# Patient Record
Sex: Female | Born: 1955 | Race: White | Hispanic: No | Marital: Married | Smoking: Never smoker
Health system: Southern US, Community
[De-identification: ages and names within clinical notes are randomized; demographics above are authoritative.]

## PROBLEM LIST (undated history)

## (undated) DIAGNOSIS — K219 Gastro-esophageal reflux disease without esophagitis: Secondary | ICD-10-CM

## (undated) DIAGNOSIS — G43909 Migraine, unspecified, not intractable, without status migrainosus: Secondary | ICD-10-CM

## (undated) DIAGNOSIS — K589 Irritable bowel syndrome without diarrhea: Secondary | ICD-10-CM

## (undated) HISTORY — PX: OTHER SURGICAL HISTORY: SHX169

## (undated) HISTORY — PX: OOPHORECTOMY: SHX86

## (undated) HISTORY — PX: FOOT SURGERY: SHX648

## (undated) HISTORY — PX: TONSILLECTOMY: SUR1361

## (undated) HISTORY — PX: NOSE SURGERY: SHX723

## (undated) HISTORY — PX: CHOLECYSTECTOMY: SHX55

## (undated) HISTORY — DX: Migraine, unspecified, not intractable, without status migrainosus: G43.909

## (undated) HISTORY — DX: Gastro-esophageal reflux disease without esophagitis: K21.9

## (undated) HISTORY — PX: KNEE SURGERY: SHX244

## (undated) HISTORY — DX: Irritable bowel syndrome, unspecified: K58.9

---

## 1999-07-31 ENCOUNTER — Ambulatory Visit (HOSPITAL_COMMUNITY): Admission: RE | Admit: 1999-07-31 | Discharge: 1999-07-31 | Payer: Self-pay | Admitting: Gastroenterology

## 2000-04-22 ENCOUNTER — Encounter: Admission: RE | Admit: 2000-04-22 | Discharge: 2000-04-22 | Payer: Self-pay | Admitting: Family Medicine

## 2000-04-22 ENCOUNTER — Encounter: Payer: Self-pay | Admitting: Family Medicine

## 2000-11-08 ENCOUNTER — Ambulatory Visit (HOSPITAL_COMMUNITY): Admission: RE | Admit: 2000-11-08 | Discharge: 2000-11-08 | Payer: Self-pay | Admitting: Gastroenterology

## 2000-11-08 ENCOUNTER — Encounter: Payer: Self-pay | Admitting: Gastroenterology

## 2000-12-02 ENCOUNTER — Ambulatory Visit (HOSPITAL_COMMUNITY): Admission: RE | Admit: 2000-12-02 | Discharge: 2000-12-02 | Payer: Self-pay | Admitting: Gastroenterology

## 2001-04-26 ENCOUNTER — Encounter: Admission: RE | Admit: 2001-04-26 | Discharge: 2001-04-26 | Payer: Self-pay | Admitting: Family Medicine

## 2001-04-26 ENCOUNTER — Encounter: Payer: Self-pay | Admitting: Family Medicine

## 2002-05-04 ENCOUNTER — Encounter: Payer: Self-pay | Admitting: Family Medicine

## 2002-05-04 ENCOUNTER — Encounter: Admission: RE | Admit: 2002-05-04 | Discharge: 2002-05-04 | Payer: Self-pay | Admitting: Family Medicine

## 2002-10-02 ENCOUNTER — Other Ambulatory Visit: Admission: RE | Admit: 2002-10-02 | Discharge: 2002-10-02 | Payer: Self-pay | Admitting: Family Medicine

## 2002-12-26 ENCOUNTER — Encounter: Payer: Self-pay | Admitting: Gastroenterology

## 2002-12-26 ENCOUNTER — Ambulatory Visit (HOSPITAL_COMMUNITY): Admission: RE | Admit: 2002-12-26 | Discharge: 2002-12-26 | Payer: Self-pay | Admitting: Gastroenterology

## 2003-07-15 ENCOUNTER — Encounter: Payer: Self-pay | Admitting: Family Medicine

## 2003-07-15 ENCOUNTER — Encounter: Admission: RE | Admit: 2003-07-15 | Discharge: 2003-07-15 | Payer: Self-pay | Admitting: Family Medicine

## 2004-07-29 ENCOUNTER — Encounter: Admission: RE | Admit: 2004-07-29 | Discharge: 2004-07-29 | Payer: Self-pay | Admitting: Family Medicine

## 2004-11-17 ENCOUNTER — Ambulatory Visit: Payer: Self-pay | Admitting: Family Medicine

## 2004-11-18 ENCOUNTER — Encounter: Admission: RE | Admit: 2004-11-18 | Discharge: 2004-11-18 | Payer: Self-pay | Admitting: Family Medicine

## 2005-03-15 ENCOUNTER — Ambulatory Visit: Payer: Self-pay | Admitting: Family Medicine

## 2005-08-13 ENCOUNTER — Encounter: Admission: RE | Admit: 2005-08-13 | Discharge: 2005-08-13 | Payer: Self-pay | Admitting: Family Medicine

## 2005-08-27 ENCOUNTER — Encounter: Admission: RE | Admit: 2005-08-27 | Discharge: 2005-08-27 | Payer: Self-pay | Admitting: Family Medicine

## 2005-12-28 ENCOUNTER — Ambulatory Visit: Payer: Self-pay | Admitting: Family Medicine

## 2006-03-24 ENCOUNTER — Other Ambulatory Visit: Admission: RE | Admit: 2006-03-24 | Discharge: 2006-03-24 | Payer: Self-pay | Admitting: Family Medicine

## 2006-03-24 ENCOUNTER — Ambulatory Visit: Payer: Self-pay | Admitting: Family Medicine

## 2006-03-24 ENCOUNTER — Encounter: Payer: Self-pay | Admitting: Family Medicine

## 2006-06-27 ENCOUNTER — Ambulatory Visit: Payer: Self-pay | Admitting: Family Medicine

## 2006-08-17 ENCOUNTER — Encounter: Admission: RE | Admit: 2006-08-17 | Discharge: 2006-08-17 | Payer: Self-pay | Admitting: Family Medicine

## 2006-08-30 ENCOUNTER — Encounter: Admission: RE | Admit: 2006-08-30 | Discharge: 2006-08-30 | Payer: Self-pay | Admitting: Family Medicine

## 2006-10-10 ENCOUNTER — Ambulatory Visit: Payer: Self-pay | Admitting: Family Medicine

## 2006-10-25 ENCOUNTER — Ambulatory Visit: Payer: Self-pay | Admitting: Family Medicine

## 2006-10-27 ENCOUNTER — Ambulatory Visit: Payer: Self-pay | Admitting: Family Medicine

## 2006-10-27 LAB — CONVERTED CEMR LAB
Basophils Absolute: 0.1 10*3/uL (ref 0.0–0.1)
Basophils Relative: 0.7 % (ref 0.0–1.0)
CO2: 30 meq/L (ref 19–32)
Eosinophils Absolute: 0.2 10*3/uL (ref 0.0–0.6)
Eosinophils Relative: 2.8 % (ref 0.0–5.0)
Monocytes Relative: 6.3 % (ref 3.0–11.0)
Neutrophils Relative %: 66.3 % (ref 43.0–77.0)
Platelets: 299 10*3/uL (ref 150–400)
WBC: 8.2 10*3/uL (ref 4.5–10.5)

## 2007-02-20 ENCOUNTER — Encounter: Admission: RE | Admit: 2007-02-20 | Discharge: 2007-02-20 | Payer: Self-pay | Admitting: Family Medicine

## 2007-02-21 ENCOUNTER — Encounter (INDEPENDENT_AMBULATORY_CARE_PROVIDER_SITE_OTHER): Payer: Self-pay | Admitting: *Deleted

## 2007-04-02 ENCOUNTER — Other Ambulatory Visit: Payer: Self-pay

## 2007-04-02 ENCOUNTER — Emergency Department: Payer: Self-pay | Admitting: Emergency Medicine

## 2007-04-18 ENCOUNTER — Encounter: Payer: Self-pay | Admitting: Family Medicine

## 2007-04-18 DIAGNOSIS — Z8679 Personal history of other diseases of the circulatory system: Secondary | ICD-10-CM

## 2007-04-18 DIAGNOSIS — Z87898 Personal history of other specified conditions: Secondary | ICD-10-CM

## 2007-04-18 DIAGNOSIS — K219 Gastro-esophageal reflux disease without esophagitis: Secondary | ICD-10-CM | POA: Insufficient documentation

## 2007-04-18 DIAGNOSIS — K589 Irritable bowel syndrome without diarrhea: Secondary | ICD-10-CM

## 2007-04-18 DIAGNOSIS — K649 Unspecified hemorrhoids: Secondary | ICD-10-CM | POA: Insufficient documentation

## 2007-08-15 ENCOUNTER — Ambulatory Visit: Payer: Self-pay | Admitting: Family Medicine

## 2007-08-15 ENCOUNTER — Telehealth: Payer: Self-pay | Admitting: Family Medicine

## 2007-08-15 DIAGNOSIS — E782 Mixed hyperlipidemia: Secondary | ICD-10-CM

## 2007-08-15 DIAGNOSIS — E669 Obesity, unspecified: Secondary | ICD-10-CM | POA: Insufficient documentation

## 2007-08-16 LAB — CONVERTED CEMR LAB
AST: 24 units/L (ref 0–37)
Albumin: 3.9 g/dL (ref 3.5–5.2)
Basophils Relative: 0.7 % (ref 0.0–1.0)
Calcium: 9.3 mg/dL (ref 8.4–10.5)
Chloride: 105 meq/L (ref 96–112)
Cholesterol: 198 mg/dL (ref 0–200)
Eosinophils Absolute: 0.4 10*3/uL (ref 0.0–0.6)
HDL: 45.2 mg/dL (ref >39.0)
Hemoglobin: 14.4 g/dL (ref 12.0–15.0)
LDL Cholesterol: 123 mg/dL — ABNORMAL HIGH (ref 0–99)
Lymphocytes Relative: 26.9 % (ref 12.0–46.0)
MCHC: 35.8 g/dL (ref 30.0–36.0)
Monocytes Absolute: 0.5 10*3/uL (ref 0.2–0.7)
Monocytes Relative: 7 % (ref 3.0–11.0)
Neutrophils Relative %: 60.3 % (ref 43.0–77.0)
Platelets: 270 10*3/uL (ref 150–400)
RDW: 12.1 % (ref 11.5–14.6)
Sed Rate: 18 mm/hr (ref 0–25)
TSH: 2.19 microintl units/mL (ref 0.35–5.50)

## 2007-08-28 ENCOUNTER — Encounter: Admission: RE | Admit: 2007-08-28 | Discharge: 2007-08-28 | Payer: Self-pay | Admitting: Family Medicine

## 2007-08-29 ENCOUNTER — Encounter (INDEPENDENT_AMBULATORY_CARE_PROVIDER_SITE_OTHER): Payer: Self-pay | Admitting: *Deleted

## 2007-09-04 ENCOUNTER — Ambulatory Visit: Payer: Self-pay | Admitting: Internal Medicine

## 2007-09-04 LAB — CONVERTED CEMR LAB
Blood in Urine, dipstick: NEGATIVE
Ketones, urine, test strip: NEGATIVE
Nitrite: NEGATIVE
Specific Gravity, Urine: >=1.03
WBC Urine, dipstick: NEGATIVE
pH: 6

## 2008-05-23 ENCOUNTER — Ambulatory Visit: Payer: Self-pay | Admitting: Family Medicine

## 2008-05-23 LAB — CONVERTED CEMR LAB
Glucose, Urine, Semiquant: NEGATIVE
pH: 5

## 2008-07-31 ENCOUNTER — Encounter: Payer: Self-pay | Admitting: Family Medicine

## 2008-08-05 ENCOUNTER — Ambulatory Visit: Payer: Self-pay | Admitting: Family Medicine

## 2008-08-07 ENCOUNTER — Ambulatory Visit: Payer: Self-pay | Admitting: Cardiology

## 2008-08-13 ENCOUNTER — Telehealth (INDEPENDENT_AMBULATORY_CARE_PROVIDER_SITE_OTHER): Payer: Self-pay | Admitting: *Deleted

## 2008-08-14 ENCOUNTER — Encounter: Payer: Self-pay | Admitting: Family Medicine

## 2008-08-14 ENCOUNTER — Ambulatory Visit: Payer: Self-pay

## 2008-08-16 LAB — CONVERTED CEMR LAB
Albumin: 4.1 g/dL (ref 3.5–5.2)
Basophils Relative: 0.3 % (ref 0.0–3.0)
Bilirubin, Direct: 0.1 mg/dL (ref 0.0–0.3)
Calcium: 9 mg/dL (ref 8.4–10.5)
Chloride: 106 meq/L (ref 96–112)
Cholesterol: 205 mg/dL (ref 0–200)
Creatinine, Ser: 0.9 mg/dL (ref 0.4–1.2)
Direct LDL: 124 mg/dL
Eosinophils Absolute: 0.3 10*3/uL (ref 0.0–0.7)
GFR calc Af Amer: 85 mL/min
HCT: 41.8 % (ref 36.0–46.0)
HDL: 42.5 mg/dL (ref >39.0)
Hemoglobin: 15 g/dL (ref 12.0–15.0)
MCHC: 35.7 g/dL (ref 30.0–36.0)
MCV: 92.8 fL (ref 78.0–100.0)
Monocytes Relative: 6.1 % (ref 3.0–12.0)
Potassium: 4.2 meq/L (ref 3.5–5.1)
RDW: 12.2 % (ref 11.5–14.6)
Sodium: 143 meq/L (ref 135–145)
Total CHOL/HDL Ratio: 4.8
Total Protein: 6.8 g/dL (ref 6.0–8.3)
Triglycerides: 158 mg/dL — ABNORMAL HIGH (ref 0–149)
WBC: 7.8 10*3/uL (ref 4.5–10.5)

## 2008-10-25 ENCOUNTER — Telehealth: Payer: Self-pay | Admitting: Family Medicine

## 2008-12-09 ENCOUNTER — Encounter: Admission: RE | Admit: 2008-12-09 | Discharge: 2008-12-09 | Payer: Self-pay | Admitting: Family Medicine

## 2008-12-12 ENCOUNTER — Encounter (INDEPENDENT_AMBULATORY_CARE_PROVIDER_SITE_OTHER): Payer: Self-pay | Admitting: *Deleted

## 2009-08-18 ENCOUNTER — Telehealth: Payer: Self-pay | Admitting: Family Medicine

## 2009-12-05 ENCOUNTER — Ambulatory Visit: Payer: Self-pay | Admitting: Family Medicine

## 2009-12-05 DIAGNOSIS — R7309 Other abnormal glucose: Secondary | ICD-10-CM

## 2009-12-05 DIAGNOSIS — R1013 Epigastric pain: Secondary | ICD-10-CM

## 2009-12-10 LAB — CONVERTED CEMR LAB
AST: 21 units/L (ref 0–37)
Albumin: 4.3 g/dL (ref 3.5–5.2)
Amylase: 48 units/L (ref 27–131)
Basophils Relative: 0.6 % (ref 0.0–3.0)
Bilirubin, Direct: 0.1 mg/dL (ref 0.0–0.3)
CO2: 32 meq/L (ref 19–32)
Calcium: 8.8 mg/dL (ref 8.4–10.5)
Chloride: 104 meq/L (ref 96–112)
Cholesterol: 223 mg/dL — ABNORMAL HIGH (ref 0–200)
Direct LDL: 149.7 mg/dL
H Pylori IgG: NEGATIVE
HDL: 60.3 mg/dL (ref >39.00)
Hgb A1c MFr Bld: 5.8 % (ref 4.6–6.5)
Lipase: 28 units/L (ref 11.0–59.0)
Monocytes Absolute: 0.5 10*3/uL (ref 0.1–1.0)
Monocytes Relative: 5.9 % (ref 3.0–12.0)
Neutro Abs: 4.9 10*3/uL (ref 1.4–7.7)
Neutrophils Relative %: 61.2 % (ref 43.0–77.0)
Potassium: 3.8 meq/L (ref 3.5–5.1)
RBC: 4.37 M/uL (ref 3.87–5.11)
RDW: 12.1 % (ref 11.5–14.6)
Sodium: 142 meq/L (ref 135–145)
Total Bilirubin: 0.8 mg/dL (ref 0.3–1.2)
Triglycerides: 112 mg/dL (ref 0.0–149.0)
VLDL: 22.4 mg/dL (ref 0.0–40.0)

## 2009-12-15 ENCOUNTER — Encounter: Admission: RE | Admit: 2009-12-15 | Discharge: 2009-12-15 | Payer: Self-pay | Admitting: Family Medicine

## 2009-12-15 LAB — HM MAMMOGRAPHY

## 2009-12-18 ENCOUNTER — Encounter: Payer: Self-pay | Admitting: Family Medicine

## 2010-08-06 ENCOUNTER — Telehealth: Payer: Self-pay | Admitting: Family Medicine

## 2010-10-24 ENCOUNTER — Encounter: Payer: Self-pay | Admitting: Family Medicine

## 2010-10-25 ENCOUNTER — Encounter: Payer: Self-pay | Admitting: Family Medicine
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
## 2010-11-05 NOTE — Letter (Signed)
Summary: Results Follow up Letter  [place] [facility]  [address] [place], [place] [address]   Phone: [phone]  Fax: [phone]    12/18/2009 MRN: [id number]    [facility] [address] [place], [place]  [address]    Dear [name],  The following are the results of your recent test(s):  Test         Result    Pap Smear:        Normal _____  Not Normal _____ Comments: ______________________________________________________ Cholesterol: LDL(Bad cholesterol):         Your goal is less than:         HDL (Good cholesterol):       Your goal is more than: Comments:  ______________________________________________________ Mammogram:        Normal __X___  Not Normal _____ Comments:Please repeat in one year.  ___________________________________________________________________ Hemoccult:        Normal _____  Not normal _______ Comments:    _____________________________________________________________________ Other Tests:    We routinely do not discuss normal results over the telephone.  If you desire a copy of the results, or you have any questions about this information we can discuss them at your next office visit.   Sincerely,    Idamae Schuller Jules Vidovich,MD  MT/ri

## 2010-11-05 NOTE — Miscellaneous (Signed)
Summary: mammogram screening  Clinical Lists Changes  Observations: Added new observation of MAMMO DUE: 12/2010 (12/18/2009 15:24) Added new observation of MAMMOGRAM: normal (12/15/2009 15:25)      Preventive Care Screening  Mammogram:    Date:  12/15/2009    Next Due:  12/2010    Results:  normal

## 2010-11-05 NOTE — Progress Notes (Signed)
Summary: Climara 0.075mg   Phone Note Refill Request Call back at [phone] Message from:  Delena Serve [address] #[id number] on August 06, 2010 2:23 PM  Refills Requested: Medication #1:  CLIMARA 0.075 MG/24HR  PTWK use as directed   Last Refilled: 04/02/2010 [facility] on [address] #[id number] electronically request refill on Climara 0.075mg /24 HR PT. Last refilled 04/02/10. Pt did not f/u after 12/05/09 visit does pt need to be seen? Please advise.    Method Requested: Telephone to Pharmacy Initial call taken by: Lewanda Rife LPN,  August 06, 2010 2:24 PM  Follow-up for Phone Call        yes - please have her f/u  px written on EMR for call in  Follow-up by: Judith Part MD,  August 06, 2010 5:08 PM  Additional Follow-up for Phone Call Additional follow up Details #1::        Unable to reach pt by phone. Medication phoned to [facility] as instructed. Left add'l message with pharmacy to ask pt to call for appt.Lewanda Rife LPN  August 07, 2010 8:23 AM     Prescriptions: CLIMARA 0.075 MG/24HR  PTWK (ESTRADIOL) use as directed  #12 x 0   Entered and Authorized by:   Judith Part MD   Signed by:   Lewanda Rife LPN on 52/84/1324   Method used:   Telephoned to ...       [facility] # [phone]* (retail)       [address]       [place], [place]  [address]       Ph: [phone]       Fax: [phone]   RxID:   [id number]

## 2010-11-09 ENCOUNTER — Ambulatory Visit (INDEPENDENT_AMBULATORY_CARE_PROVIDER_SITE_OTHER): Payer: Federal, State, Local not specified - PPO | Admitting: Family Medicine

## 2010-11-09 ENCOUNTER — Encounter: Payer: Self-pay | Admitting: Family Medicine

## 2010-11-09 DIAGNOSIS — J209 Acute bronchitis, unspecified: Secondary | ICD-10-CM

## 2010-11-18 ENCOUNTER — Other Ambulatory Visit: Payer: Self-pay | Admitting: Family Medicine

## 2010-11-18 ENCOUNTER — Telehealth: Payer: Self-pay | Admitting: Family Medicine

## 2010-11-18 DIAGNOSIS — Z1231 Encounter for screening mammogram for malignant neoplasm of breast: Secondary | ICD-10-CM

## 2010-11-19 NOTE — Assessment & Plan Note (Signed)
Summary: COUGH/CLE   [employer]   Vital Signs:  Patient profile:   55 year old female Height:      64.25 inches Weight:      239.25 pounds BMI:     40.90 O2 Sat:      99 % on Room air Temp:     98.7 degrees F oral Pulse rate:   100 / minute Pulse rhythm:   regular BP sitting:   130 / 82  (left arm) Cuff size:   large  Vitals Entered By: Selena Batten Dance CMA (AAMA) (November 09, 2010 2:21 PM)  O2 Flow:  Room air CC: Cough x1 week (Greenish sputum)   History of Present Illness: CC: cough  1 wk h/o illness.  + laryngitis and ST initially as well as tired, fatigue, cough.  + achey all over.  Last 2 nights unable to sleep flat 2/2 cough.  Taking mucinex D, alkaseltzer cold/cough, tussionex, .  + HA.  cough bringing up green sputum.  feels coughing well.  No fevers/chills, abd pain, n/v/d, myalgias, facial pressure, ear pain or tooth pain.  no sweating or dizziness, no exp cough.  + husband sick as well, no smokers at home.  no h/o asthma  Current Medications (verified): 1)  Climara 0.075 Mg/24hr  Ptwk (Estradiol) .... Use As Directed 2)  Zegerid 40-1100 Mg Caps (Omeprazole-Sodium Bicarbonate) .... Take One Capsule By Mouth Once Daily 3)  Zomig 5 Mg Tabs (Zolmitriptan) .... 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  Alcohol Use - yes (rare)  Review of Systems       per HPI  Physical Exam  General:  overweight but generally well appearing  Head:  normocephalic, atraumatic, and no abnormalities observed.   Eyes:  vision grossly intact, pupils equal, pupils round, and pupils reactive to light.  no conjunctival pallor, injection or icterus  Ears:  TMs clear bilaterally Nose:  nares clear bilaterally Mouth:  MMM, no pharyngeal erythema/  edema/exudates Neck:  supple with full rom and no masses or thyromegally, no JVD or carotid bruit , no LAD Lungs:  Normal respiratory effort, chest expands symmetrically. Lungs are clear to auscultation, no crackles or wheezes. Heart:  Normal rate and regular rhythm. S1 and S2 normal without gallop, murmur, click, rub or other extra sounds. Pulses:  2+ rad pulses, brisk cap refill Extremities:  no pedal edema Skin:  Intact without suspicious lesions or rashes no pallor or jaundice    Impression & Recommendations:  Problem # 1:  ACUTE BRONCHITIS (ICD-466.0) 1 wk into illness.  no need for abx yet.  supportive care with tussionex for cough at night.  red flags to return discussed.  update [place] at end of week if not better for consideration of zpack.  Her updated medication list for this problem includes:    Tussionex Pennkinetic Er 10-8 Mg/24ml Lqcr (Hydrocod polst-chlorphen polst) ..... One teaspoon two times a day as needed cough, sedation precautions  Complete Medication List: 1)  Climara 0.075 Mg/24hr Ptwk (Estradiol) .... Use as directed 2)  Zegerid 40-1100 Mg Caps (Omeprazole-sodium bicarbonate) .... Take one capsule by mouth once daily 3)  Zomig 5 Mg Tabs (Zolmitriptan) .... As directed 4)  Tussionex Pennkinetic Er 10-8 Mg/24ml Lqcr (Hydrocod polst-chlorphen polst) .... One teaspoon two  times a day as needed cough, sedation precautions  Patient Instructions: 1)  Sounds like you have a viral upper respiratory infection. 2)  Antibiotics are not needed for this.  Viral infections usually take 7-10 days to resolve.  The cough can last 4 weeks to go away. 3)  Use medication as prescribed: tussionex pennkinetic for cough.   4)  push fluids and plenty of rest. 5)  Please return if you are not improving as expected, or if you have high fevers (>101.5) or difficulty swallowing. 6)  Call clinic with questions.  Pleasure to see you today!  Prescriptions: Sandria Senter ER 10-8 MG/5ML  LQCR (HYDROCOD POLST-CHLORPHEN POLST) one teaspoon two times a day as needed cough, sedation precautions  #240cc x 0   Entered and Authorized by:   Eustaquio Boyden  MD   Signed by:   Eustaquio Boyden  MD on 11/09/2010   Method used:   Print then Give to Patient   RxID:   [id number] ZEGERID 40-1100 MG CAPS (OMEPRAZOLE-SODIUM BICARBONATE) take one capsule by mouth once daily  #90 x 1   Entered and Authorized by:   Eustaquio Boyden  MD   Signed by:   Eustaquio Boyden  MD on 11/09/2010   Method used:   Print then Give to Patient   RxID:   [id number]    Orders Added: 1)  Est. Patient Level III [84696]    Current Allergies (reviewed today): VICODIN

## 2010-11-25 NOTE — Progress Notes (Signed)
Summary: prior auth given for zegerid  Phone Note Other Incoming   Caller: [employer] of Call: Prior auth given over the phone for zegerid, approval letter is in your box.                    Lowella Petties CMA, AAMA  November 18, 2010 9:44 AM ov  Follow-up for Phone Call        noted.  asked to scan. Follow-up by: Eustaquio Boyden  MD,  November 18, 2010 12:55 PM

## 2010-12-01 NOTE — Medication Information (Signed)
Summary: Prior Authorization  Prior Authorization   Imported By: Kassie Mends 11/24/2010 11:23:59  _____________________________________________________________________  External Attachment:    Type:   Image     Comment:   External Document

## 2010-12-07 ENCOUNTER — Encounter: Payer: Self-pay | Admitting: Family Medicine

## 2010-12-07 DIAGNOSIS — K219 Gastro-esophageal reflux disease without esophagitis: Secondary | ICD-10-CM

## 2010-12-07 DIAGNOSIS — G43909 Migraine, unspecified, not intractable, without status migrainosus: Secondary | ICD-10-CM | POA: Insufficient documentation

## 2010-12-07 DIAGNOSIS — K589 Irritable bowel syndrome without diarrhea: Secondary | ICD-10-CM | POA: Insufficient documentation

## 2011-01-12 ENCOUNTER — Other Ambulatory Visit: Payer: Self-pay | Admitting: Family Medicine

## 2011-01-15 ENCOUNTER — Telehealth: Payer: Self-pay | Admitting: Family Medicine

## 2011-01-15 DIAGNOSIS — Z0000 Encounter for general adult medical examination without abnormal findings: Secondary | ICD-10-CM

## 2011-01-15 DIAGNOSIS — R7309 Other abnormal glucose: Secondary | ICD-10-CM

## 2011-01-15 DIAGNOSIS — E782 Mixed hyperlipidemia: Secondary | ICD-10-CM

## 2011-01-15 NOTE — Telephone Encounter (Signed)
Message copied by Roxy Manns on Fri Jan 15, 2011 11:48 AM ------      Message from: Mills Koller      Created: Thu Jan 14, 2011  4:56 PM       Patient is scheduled for CPX labs, please order future labs, Thanks , Camelia Eng

## 2011-01-22 ENCOUNTER — Other Ambulatory Visit (INDEPENDENT_AMBULATORY_CARE_PROVIDER_SITE_OTHER): Payer: Federal, State, Local not specified - PPO | Admitting: Family Medicine

## 2011-01-22 DIAGNOSIS — E785 Hyperlipidemia, unspecified: Secondary | ICD-10-CM

## 2011-01-22 DIAGNOSIS — E782 Mixed hyperlipidemia: Secondary | ICD-10-CM

## 2011-01-22 DIAGNOSIS — R7309 Other abnormal glucose: Secondary | ICD-10-CM

## 2011-01-22 DIAGNOSIS — Z0000 Encounter for general adult medical examination without abnormal findings: Secondary | ICD-10-CM

## 2011-01-22 LAB — LIPID PANEL
Cholesterol: 202 mg/dL — ABNORMAL HIGH (ref 0–200)
HDL: 45.9 mg/dL (ref >39.00)
Total CHOL/HDL Ratio: 4
VLDL: 22.4 mg/dL (ref 0.0–40.0)

## 2011-01-22 LAB — COMPREHENSIVE METABOLIC PANEL
AST: 16 U/L (ref 0–37)
Albumin: 4.2 g/dL (ref 3.5–5.2)
Chloride: 103 mEq/L (ref 96–112)
GFR: 96.33 mL/min (ref >60.00)
Glucose, Bld: 93 mg/dL (ref 70–99)
Potassium: 4.2 mEq/L (ref 3.5–5.1)
Sodium: 141 mEq/L (ref 135–145)
Total Bilirubin: 0.8 mg/dL (ref 0.3–1.2)
Total Protein: 6.7 g/dL (ref 6.0–8.3)

## 2011-01-22 LAB — CBC WITH DIFFERENTIAL/PLATELET
Basophils Absolute: 0 10*3/uL (ref 0.0–0.1)
Eosinophils Absolute: 0.3 10*3/uL (ref 0.0–0.7)
Eosinophils Relative: 3.6 % (ref 0.0–5.0)
HCT: 39.9 % (ref 36.0–46.0)
Hemoglobin: 14.2 g/dL (ref 12.0–15.0)
MCHC: 35.5 g/dL (ref 30.0–36.0)
MCV: 92.9 fl (ref 78.0–100.0)
Monocytes Absolute: 0.5 10*3/uL (ref 0.1–1.0)
Platelets: 268 10*3/uL (ref 150.0–400.0)
WBC: 7.8 10*3/uL (ref 4.5–10.5)

## 2011-01-22 LAB — TSH: TSH: 2.24 u[IU]/mL (ref 0.35–5.50)

## 2011-01-27 ENCOUNTER — Encounter: Payer: Self-pay | Admitting: Family Medicine

## 2011-01-27 ENCOUNTER — Ambulatory Visit
Admission: RE | Admit: 2011-01-27 | Discharge: 2011-01-27 | Disposition: A | Discharge status: Home / Self Care | Payer: Federal, State, Local not specified - PPO | Source: Ambulatory Visit | Attending: Family Medicine | Admitting: Family Medicine

## 2011-01-27 ENCOUNTER — Other Ambulatory Visit: Payer: Self-pay | Admitting: Family Medicine

## 2011-01-27 ENCOUNTER — Ambulatory Visit (INDEPENDENT_AMBULATORY_CARE_PROVIDER_SITE_OTHER): Payer: Federal, State, Local not specified - PPO | Admitting: Family Medicine

## 2011-01-27 DIAGNOSIS — E669 Obesity, unspecified: Secondary | ICD-10-CM

## 2011-01-27 DIAGNOSIS — G43909 Migraine, unspecified, not intractable, without status migrainosus: Secondary | ICD-10-CM

## 2011-01-27 DIAGNOSIS — R7309 Other abnormal glucose: Secondary | ICD-10-CM

## 2011-01-27 DIAGNOSIS — E782 Mixed hyperlipidemia: Secondary | ICD-10-CM

## 2011-01-27 DIAGNOSIS — R928 Other abnormal and inconclusive findings on diagnostic imaging of breast: Secondary | ICD-10-CM

## 2011-01-27 DIAGNOSIS — R42 Dizziness and giddiness: Secondary | ICD-10-CM

## 2011-01-27 DIAGNOSIS — Z23 Encounter for immunization: Secondary | ICD-10-CM

## 2011-01-27 DIAGNOSIS — Z1231 Encounter for screening mammogram for malignant neoplasm of breast: Secondary | ICD-10-CM

## 2011-01-27 DIAGNOSIS — Z0000 Encounter for general adult medical examination without abnormal findings: Secondary | ICD-10-CM

## 2011-01-27 MED ORDER — ESTRADIOL 0.025 MG/24HR TD PTWK: 1.0000 | MEDICATED_PATCH | TRANSDERMAL | Status: DC

## 2011-01-27 MED ORDER — FLUTICASONE PROPIONATE 50 MCG/ACT NA SUSP: 2.0000 | Freq: Every day | NASAL | Status: DC
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
## 2011-01-27 NOTE — Assessment & Plan Note (Signed)
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

## 2011-01-27 NOTE — Patient Instructions (Addendum)
Try to get 1200-1500 mg of calcium per day with at least 1000 iu of vitamin D - for bone health  Start exercise 5 days per week  Avoid red meat/ fried foods/ egg yolks/ fatty breakfast meats/ butter, cheese and high fat dairy/ and shellfish   Decreasing your estrogen patch to .025 - let me know how it goes Try flonase for dizziness and allergy symptoms Tdap vaccine today Follow up with me in about 6 months

## 2011-01-27 NOTE — Assessment & Plan Note (Signed)
bmi now over 40  Disc comorbidities of obesity and plan for wt loss

## 2011-01-27 NOTE — Assessment & Plan Note (Signed)
Reviewed health habits including diet and exercise and skin cancer prevention Also reviewed health mt list, fam hx and immunizations  Tdap today For mam today and colnosc planned  Disc wt loss plan

## 2011-01-27 NOTE — Assessment & Plan Note (Signed)
This is improved Urged to keep working on low sat fat diet

## 2011-01-27 NOTE — Assessment & Plan Note (Signed)
Menopause related Will try to step down hrt Disc risks in detail  Dec climara to .025 and update

## 2011-01-27 NOTE — Assessment & Plan Note (Signed)
This is stable with a1c of 5.9 Disc borderline DM Disc need for low glycemic diet and wt loss

## 2011-02-01 ENCOUNTER — Ambulatory Visit
Admission: RE | Admit: 2011-02-01 | Discharge: 2011-02-01 | Disposition: A | Discharge status: Home / Self Care | Payer: Federal, State, Local not specified - PPO | Source: Ambulatory Visit | Attending: Family Medicine | Admitting: Family Medicine

## 2011-02-01 DIAGNOSIS — R928 Other abnormal and inconclusive findings on diagnostic imaging of breast: Secondary | ICD-10-CM

## 2011-02-02 ENCOUNTER — Telehealth: Payer: Self-pay

## 2011-02-02 NOTE — Telephone Encounter (Signed)
Patient notified as instructed by telephone. 

## 2011-02-02 NOTE — Telephone Encounter (Signed)
I would go ahead and taper -- stopping cold [place] may be too miserable

## 2011-02-02 NOTE — Telephone Encounter (Signed)
Pt had 2nd mammogram and ultrasound done. Pt said she was told thickening in breast with 3 tumors which were fluid cyst. No cancer was seen. Pt said she has been doing Estrogen patches .075mg  and was going to decrease to .025mg  for 1 month and then stop patches. After the add'l views and [place] she is scared and wants to know if should stop the Estrogen now or should she taper off as previously planned. Please advise. May call pt on cell [phone] or work # [phone].

## 2011-02-16 NOTE — Assessment & Plan Note (Signed)
[facility]                            CARDIOLOGY OFFICE NOTE   NAME:REAGANAzelia, Reese                    MRN:          [id number]  DATE:08/07/2008                            DOB:          1955-11-07    [name] is a pleasant 55 year old female, who presents for  evaluation of chest pain.  She has no prior cardiac history.  Over the  past 1-1/2 months, the patient has had occasional chest tightness.  It  is substernal in location without radiation.  It is not pleuritic nor is  it related to food.  It is not exertional.  It can increase with lying  flat.  It can last for hours at a time and there was one point where it  had lasted all week.  There is no radiation.  There is no associated  shortness of breath, nausea, vomiting, or diaphoresis.  There are no  relieving factors.  Note, she does have a history of reflux, but states  that pain is typically in her back.  Because of the above, we were asked  to further evaluate.  Note, she does not have exertional chest pain nor  is there dyspnea on exertion, orthopnea, PND, pedal edema, palpitations,  presyncope, or syncope.  She has had some dizziness recently when she  stands.   Her medications at present include estrogen 0.075 q. weekly, Zegerid and  atenolol 10 mg p.o. daily.  She takes Zomig as needed.   SOCIAL HISTORY:  She does not smoke.  She rarely consumes alcohol.  Her  family history is significant for her father who had a myocardial  infarction but it was at age 79.  There is no other premature coronary  artery disease noted.   PAST MEDICAL HISTORY:  There is no diabetes mellitus, hypertension, or  hyperlipidemia by her report.  She did state her cholesterol has been  borderline in the past.  She does have a history of migraine headaches.  There is a history of irritable bowel syndrome as well as  gastroesophageal disease.  She has had previous cholecystectomy.  She  has also had  bilateral foot surgery as well as surgery on her knee.  There is no other significant past medical history noted.   PHYSICAL EXAMINATION:  Blood pressure of 142/96 and pulse is 105.  She  weighs 237 pounds.  She is well developed and somewhat obese.  She is no  acute stress at present.  Her skin is warm and dry.  She is not acutely  depressed.  There is no peripheral clubbing.  Her back is normal.  Her  HEENT is normal with normal eyelids.  Her neck is supple with a normal  upstroke bilaterally.  No bruits noted.  There is no jugular venous  distention.  I can appreciate thyromegaly.  Her chest is clear to  auscultation.  No expansion.  Cardiovascular reveals a regular rhythm  with normal S1 and S2.  There are no murmurs, rubs, or gallops noted.  Abdominal exam is nontender, nondistended.  Positive bowel sounds.  No  hepatosplenomegaly, no masses appreciated.  There is no abdominal bruit.  He has 2+ femoral pulses bilaterally and no bruits.  The extremities  show no edema.  I can palpate no cords.  She has 2+ dorsalis pedis  pulses bilaterally.  Her neurologic exam is grossly intact.   Her electrocardiogram shows a normal sinus rhythm at a rate of 97.  There is poor R-wave progression.  There are nonspecific ST changes.   DIAGNOSES:  1. Atypical chest pain - Mrs. Curto's chest pain is somewhat atypical      and that it can last for hours at a time.  It also increases lying      flat.  I wonder whether it may be related to reflux.  We will      schedule her to have stress Myoview.  If it shows no ischemia, then      we will continue medical therapy and she can have further workup      for noncardiac causes of chest pain.  2. Elevated blood pressure - this will need to be tracked and if it      remains elevated then certainly therapy needs to be initiated in      the future.  3. Gastroesophageal reflux disease - she will continue on her present      medications.  4. History of  irritable bowel syndrome.   We will see her back on an as-needed basis pending the results of her  Myoview.     Madolyn Frieze Jens Som, MD, [facility]  Electronically Signed    BSC/MedQ  DD: 08/07/2008  DT: 08/08/2008  Job #: [id number]   cc:   Kerby Nora, MD

## 2011-03-05 ENCOUNTER — Telehealth: Payer: Self-pay

## 2011-03-05 HISTORY — PX: HERNIA REPAIR: SHX51

## 2011-03-05 NOTE — Telephone Encounter (Signed)
I called pt to see if scheduled the 6 mth f/u mammo appt and pt said she received letter from [facility] that they would send her a letter 1 month prior to appt time to schedule the f/u mammo and pt would then call for appt.

## 2011-03-12 ENCOUNTER — Telehealth: Payer: Self-pay | Admitting: *Deleted

## 2011-03-12 ENCOUNTER — Emergency Department (HOSPITAL_COMMUNITY): Payer: Federal, State, Local not specified - PPO

## 2011-03-12 ENCOUNTER — Emergency Department (HOSPITAL_COMMUNITY)
Admission: EM | Admit: 2011-03-12 | Discharge: 2011-03-12 | Disposition: A | Discharge status: Home / Self Care | Payer: Federal, State, Local not specified - PPO | Attending: Emergency Medicine | Admitting: Emergency Medicine

## 2011-03-12 DIAGNOSIS — K429 Umbilical hernia without obstruction or gangrene: Secondary | ICD-10-CM | POA: Insufficient documentation

## 2011-03-12 DIAGNOSIS — K219 Gastro-esophageal reflux disease without esophagitis: Secondary | ICD-10-CM | POA: Insufficient documentation

## 2011-03-12 DIAGNOSIS — K589 Irritable bowel syndrome without diarrhea: Secondary | ICD-10-CM | POA: Insufficient documentation

## 2011-03-12 DIAGNOSIS — R1032 Left lower quadrant pain: Secondary | ICD-10-CM | POA: Insufficient documentation

## 2011-03-12 LAB — URINALYSIS, ROUTINE W REFLEX MICROSCOPIC
Hgb urine dipstick: NEGATIVE
Leukocytes, UA: NEGATIVE
Nitrite: NEGATIVE
Protein, ur: NEGATIVE mg/dL
Specific Gravity, Urine: 1.019 (ref 1.005–1.030)
Urobilinogen, UA: 0.2 mg/dL (ref 0.0–1.0)

## 2011-03-12 LAB — CBC
Hemoglobin: 14.5 g/dL (ref 12.0–15.0)
Platelets: 231 10*3/uL (ref 150–400)
RBC: 4.59 MIL/uL (ref 3.87–5.11)
WBC: 7.3 10*3/uL (ref 4.0–10.5)

## 2011-03-12 LAB — DIFFERENTIAL
Basophils Absolute: 0.1 10*3/uL (ref 0.0–0.1)
Basophils Relative: 1 % (ref 0–1)
Eosinophils Absolute: 0.3 10*3/uL (ref 0.0–0.7)
Neutro Abs: 4.5 10*3/uL (ref 1.7–7.7)
Neutrophils Relative %: 62 % (ref 43–77)

## 2011-03-12 LAB — BASIC METABOLIC PANEL
CO2: 25 mEq/L (ref 19–32)
Calcium: 9.1 mg/dL (ref 8.4–10.5)
Glucose, Bld: 139 mg/dL — ABNORMAL HIGH (ref 70–99)
Potassium: 4 mEq/L (ref 3.5–5.1)
Sodium: 139 mEq/L (ref 135–145)

## 2011-03-12 MED ORDER — IOHEXOL 300 MG/ML  SOLN
100.0000 mL | Freq: Once | INTRAMUSCULAR | Status: AC | PRN
  Administered 2011-03-12: 100 mL via INTRAVENOUS

## 2011-03-12 NOTE — Telephone Encounter (Signed)
Triage Record Num: [id number] Operator: Chevis Pretty Patient Name: Cassidy Reese Call Date & Time: 03/12/2011 7:11:57AM Patient Phone: [phone] PCP: Patient Gender: Female PCP Fax : Patient DOB: 1956/04/05 Practice Name: [facility] Reason for Call: Patient calling about abdominal pain which began 03/11/11 1715, but has now progressed to where the abdomen is tender to touch and very tender. Pain is localized to umbilical area, sl to the L. Pain is crampy but abdomen is tender to touch constantly. Mild bloating noted; did try gas relief tablets without improvement. No changes in BMs or urinary symptoms. Had colonostomy ~ 3-4 weeks ago and everything looked good. Patient states she does have a problem with IBS. Afebrile. Was not able to sleep due to pain. Per protocol, advised ED now; states will go to [facility] ED. Protocol(s) Used: Abdominal Pain Recommended Outcome per Protocol: See ED Immediately Reason for Outcome: Abdominal pain that has steadily worsened over hours OR has been continuous for 3 hours or more AND any of the following: loss of appetite, vomiting starting after pain, any fever, OR unable to carry out normal activities Care Advice: ~ Another adult should drive. ~ Pain medication or laxatives should not be taken until symptoms are evaluated. ~ Do not eat or drink anything until evaluated by provider. Call EMS 911 if signs and symptoms of shock develop (such as unable to stand due to faintness, dizziness, or lightheadedness; new onset of confusion; slow to respond or difficult to awaken; skin is pale, gray, cool, or moist to touch; severe weakness; loss of consciousness). ~ ~ IMMEDIATE ACTION Write down provider's name. List or place the following in a bag for transport with the patient: current prescription and/or nonprescription medications; alternative treatments, therapies and medications; and street drugs. ~ 06/

## 2011-03-15 ENCOUNTER — Telehealth: Payer: Self-pay | Admitting: *Deleted

## 2011-03-15 NOTE — Telephone Encounter (Signed)
Patient was dx with a hernia when she went to ER. She is scheduled to see the surgeon in 2 weeks, but says that she is in so much pain and can't wait that long. She is asking if you could refer her to a place that could see her sooner or if we could call [facility] to see if we could get the appt sooner.

## 2011-03-15 NOTE — Telephone Encounter (Signed)
I reviewed her ER notes- looks like periumbilical hernia  Shirlee Limerick - any ideas ? - could she be seen earlier in [place] or elsewhere? If her pain is severe right now - best thing is to go back to ER (in emergency they could call surgeon on call)

## 2011-03-16 NOTE — Telephone Encounter (Signed)
Patient notified as instructed by telephone. Pt still in a lot of pain. I called [facility] in [place] and spoke with Zella Ball and pt has appt tomorrow 03/17/11 at 4:30pm to see surgeon. Pt very appreciative.Pt will go to ER if cannot wait until tomorrow to see surgeon if pain worsens.

## 2011-03-16 NOTE — Telephone Encounter (Signed)
Thank you so much!- I'm appreciative you got her in

## 2011-03-22 ENCOUNTER — Encounter: Payer: Self-pay | Admitting: Family Medicine

## 2011-03-22 ENCOUNTER — Other Ambulatory Visit (INDEPENDENT_AMBULATORY_CARE_PROVIDER_SITE_OTHER): Payer: Self-pay | Admitting: General Surgery

## 2011-03-22 ENCOUNTER — Ambulatory Visit
Admission: RE | Admit: 2011-03-22 | Discharge: 2011-03-22 | Disposition: A | Discharge status: Home / Self Care | Payer: Federal, State, Local not specified - PPO | Source: Ambulatory Visit | Attending: General Surgery | Admitting: General Surgery

## 2011-03-22 DIAGNOSIS — Z01811 Encounter for preprocedural respiratory examination: Secondary | ICD-10-CM

## 2011-03-23 ENCOUNTER — Ambulatory Visit (HOSPITAL_BASED_OUTPATIENT_CLINIC_OR_DEPARTMENT_OTHER)
Admission: RE | Admit: 2011-03-23 | Discharge: 2011-03-23 | Disposition: A | Discharge status: Home / Self Care | Payer: Federal, State, Local not specified - PPO | Source: Ambulatory Visit | Attending: General Surgery | Admitting: General Surgery

## 2011-03-23 ENCOUNTER — Other Ambulatory Visit (INDEPENDENT_AMBULATORY_CARE_PROVIDER_SITE_OTHER): Payer: Self-pay | Admitting: General Surgery

## 2011-03-23 DIAGNOSIS — K589 Irritable bowel syndrome without diarrhea: Secondary | ICD-10-CM | POA: Insufficient documentation

## 2011-03-23 DIAGNOSIS — K420 Umbilical hernia with obstruction, without gangrene: Secondary | ICD-10-CM | POA: Insufficient documentation

## 2011-03-23 DIAGNOSIS — K219 Gastro-esophageal reflux disease without esophagitis: Secondary | ICD-10-CM | POA: Insufficient documentation

## 2011-03-23 DIAGNOSIS — Z01812 Encounter for preprocedural laboratory examination: Secondary | ICD-10-CM | POA: Insufficient documentation

## 2011-03-23 DIAGNOSIS — E669 Obesity, unspecified: Secondary | ICD-10-CM | POA: Insufficient documentation

## 2011-04-01 ENCOUNTER — Encounter (INDEPENDENT_AMBULATORY_CARE_PROVIDER_SITE_OTHER): Payer: Self-pay | Admitting: Surgery

## 2011-04-01 ENCOUNTER — Ambulatory Visit (INDEPENDENT_AMBULATORY_CARE_PROVIDER_SITE_OTHER): Payer: Federal, State, Local not specified - PPO | Admitting: Surgery

## 2011-04-01 VITALS — BP 148/98 | HR 108 | Temp 97.8°F | Ht 64.0 in | Wt 231.0 lb

## 2011-04-01 DIAGNOSIS — K420 Umbilical hernia with obstruction, without gangrene: Secondary | ICD-10-CM

## 2011-04-01 NOTE — Progress Notes (Signed)
Subjective:     Patient ID: Cassidy Reese, female   DOB: 04/04/1956, 55 y.o.   MRN: [id number]    BP 148/98  Pulse 108  Temp 97.8 F (36.6 C)  Ht 5\' 4"  (1.626 m)  Wt 231 lb (104.781 kg)  BMI 39.65 kg/m2  LMP 10/04/1985    HPI  Diagnosis incarcerated umbilical hernia.  Procedure primary reduction and repair of incarcerated or hernia by Dr. Consuello Bossier  This patient comes in today feeling quite sore. She could not tolerate the tramadol due to nausea. She has problems with narcotics. She has never tried oxycodone. She is eating better off narcotics.  She is taking nothing for pain  She is hoping return to work in mid July. She has some lumpiness to the right of the umbilicus. No drainage. The sutures remain.  Review of Systems  Gastrointestinal: Positive for abdominal pain. Negative for nausea, vomiting and constipation.       Right of bellybutton  All other systems reviewed and are negative.       Objective:   Physical Exam  Constitutional: She is oriented to person, place, and time. She appears well-developed and well-nourished. No distress.  HENT:  Head: Normocephalic.  Mouth/Throat: Oropharynx is clear and moist.  Eyes: EOM are normal.  Cardiovascular: Normal rate.   No murmur heard. Pulmonary/Chest: Effort normal. No respiratory distress.  Abdominal: Soft. She exhibits no distension. There is no rebound and no guarding.       Mild eccymosis at incision.  Healing ridge ?hematoma.  No infection.  Sutures removed  Musculoskeletal: Normal range of motion. She exhibits no edema and no tenderness.  Neurological: She is alert and oriented to person, place, and time. No cranial nerve deficit. Coordination normal.  Skin: Skin is warm and dry. No rash noted. She is not diaphoretic. No pallor.  Psychiatric: She has a normal mood and affect. Her behavior is normal.       Assessment:     Incarcerated umbilical hernia status post repair. Poor pain control due to  not taking any pain meds    Plan:     Control pain better. Start naproxen. Start eyes/heat. Consider trying oxycodone. (Prescription #40). I added Phenergan as well to minimize nausea.  Continue bowel regimen.  Return to clinic see Dr. Zachery Dakins in 2 weeks.  Probably okay to return to moderate activity in a few weeks.  Call sooner if she's feeling worse or pain is not controlled.

## 2011-04-01 NOTE — Patient Instructions (Signed)
See handouts.  Call if worse.  Control pain better!!

## 2011-04-13 NOTE — Op Note (Signed)
Cassidy Reese, Cassidy Reese             ACCOUNT NO.:  [id number]  MEDICAL RECORD NO.:  [id number]  LOCATION:  [facility]                         FACILITY:  [facility]  PHYSICIAN:  Anselm Pancoast. Daelan Gatt, M.D.DATE OF BIRTH:  [birth year]  DATE OF PROCEDURE:  03/23/2011 DATE OF DISCHARGE:  03/12/2011                              OPERATIVE REPORT   PREOPERATIVE DIAGNOSIS:  Umbilical hernia.  Previous incarceration, seen in the emergency room last week.  OPERATION:  Repair of umbilical hernia with a Ventralex patch, small size, general anesthesia, local supplementation.  HISTORY:  Cassidy Reese is a 55 year old moderately overweight female who I saw on the office last week with the following history.  She had been seen in the emergency room few days earlier when she presented with abdominal pain, nausea and vomiting, was worked up with lab studies and then a CT was performed that showed a fairly small neck but fairly prominent umbilical hernia that was not obviously noted on physical exam because of her exogenous obesity.  The area of pain subsided.  There was no evidence of incarceration like a  loop of transverse colon within it and then she was advised to be seen by general surgeon.  She was seen in the emergency room on the 8th and I saw her in the office approximately a week later on the 13th.  At that time she was not having any slightly obvious bad pain but was tender at the umbilicus if you push on the area.  She has had a problem of pain medication as far as Vicodin, codeine and all give her significant rigidity and nausea and vomiting and we talked about maybe using tramadol postoperatively for pain since the Vicodin-type medications are not tolerated.  She is here for the planned procedure, did take a laxative yesterday to basically make sure that she was not constipated at the time of surgery.  The patient was taken to the operative suite.  She was given 1 g of Ancef and then the area  she has had laparoscopic assisted GYN procedure and whether the hernia, there was a true umbilical hernia at possibly the port site, I could not tell for sure.  I had removed the old scar where the trocar was placed below the umbilicus and then took a little bit of the excess skin off and then using retractors on the skin elevated and then separated this kind of preperitoneal and hernia sac from the surrounding adipose tissue.  We dissected right down to the actual fascial defect and the defect was about size of a nickel, but with her size I think it would be better if we try to put mesh in the preperitoneal space.  I very carefully went down through the various layers in this hernia sac, incarcerated omentum, etc.  I did not identify or see any loops of small bowel or large and the little areas, the preperitoneal pedicles and all were tied with 2-0 Vicryl sutures. Then when I had everything freed circumferentially, I could run my finger right around the little fascial edge.  I think was actually intraperitoneally but it may be extraperitoneally and I then used a small size Ventralex patch, cut the little  wings or tails off it and sutured it up under the abdomen with 4 sutures of 0 Prolene and then tied them all at completion.  We then anesthetized the fascia and then little defect actually closed with interrupted sutures of 0 Surgilon. The defect as far as in the subcutaneous tissue was closed in layers using 3-0 Vicryl, trimmed a little bit more skin so would have a nice little closure where we were using a lot of retraction working through the small incision.  The actual skin was closed with 4-0 nylon alternating simple and mattress sutures and then Neosporin antibiotic ointment on the incision and occlusive dressing.  I will keep the area dry until Friday and the stitches could be removed after about 10 days. Whether she will see me or wait and see me in about 2 weeks after returning  from vacation will be left up to the patient.  Sponge and needle counts were correct. Estimated blood loss was minimal.  All the contents of hernia sac and fatty tissue and little bit of omentum incarcerated in this was sent together as hernia sac and contents.  Her regular medical doctor is Dr. Roxy Manns.     Anselm Pancoast. Zachery Dakins, M.D.     WJW/MEDQ  D:  03/23/2011  T:  03/23/2011  Job:  [id number]  Electronically Signed by Consuello Bossier M.D. on 04/13/2011 03:49:43 PM

## 2011-04-14 ENCOUNTER — Ambulatory Visit (INDEPENDENT_AMBULATORY_CARE_PROVIDER_SITE_OTHER): Payer: Federal, State, Local not specified - PPO | Admitting: General Surgery

## 2011-04-14 ENCOUNTER — Encounter (INDEPENDENT_AMBULATORY_CARE_PROVIDER_SITE_OTHER): Payer: Self-pay | Admitting: General Surgery

## 2011-04-14 DIAGNOSIS — K420 Umbilical hernia with obstruction, without gangrene: Secondary | ICD-10-CM

## 2011-04-14 NOTE — Progress Notes (Signed)
Cassidy Reese returns now approximately 3 weeks following her umbilical hernia repair that was repaired with mesh. She states the incisional pain is decreasing but is not completely resolved. She had a previous open cholecystectomy through a fairly large incision and states that the pain is a little more on the right side where I felt the pain would be more than the left since her nerve distribution was interrupted from the previous open cholecystectomy many years earlier.    Her incision where the umbilical hernia repair it is healing nicely with no evidence of any inflammation or problems noted on physical exam her bowel regularity is normal and she is returning to work but was given a work release with restriction to 35 pounds for the next 3 weeks if she thinks that she is having minimal incisional pain and she felt his usual happy to see her again in approximately 4-6 weeks but she does not think that will be necessary

## 2011-04-14 NOTE — Patient Instructions (Signed)
May return to work. Enclosed work release. If more pain  Call for followup

## 2011-07-17 IMAGING — CT CT ABD-PELV W/ CM
2 of 6 series · 17 of 46 positions shown, 19 images · IV contrast (CONTRAST)
Comparison: None.

CLINICAL DATA: The sided abdominal pain for 1 day, bloating

CT ABDOMEN AND PELVIS WITH CONTRAST
TECHNIQUE: Multidetector CT imaging of the abdomen and pelvis was
performed following the standard protocol during bolus
administration of intravenous contrast.
Contrast: 100 ml [id number]

[Series 2: routine · axial · 0.81mm/px · z∈[-519,-89]mm · 14 of 100 slices shown, 16 images]
[im 7/100  soft-tissue]
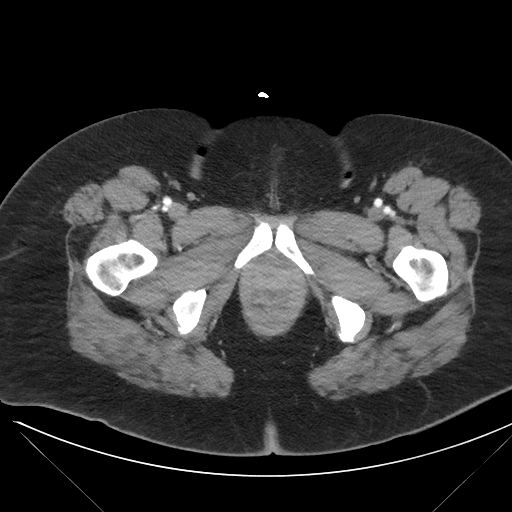
[im 7/100  bone]
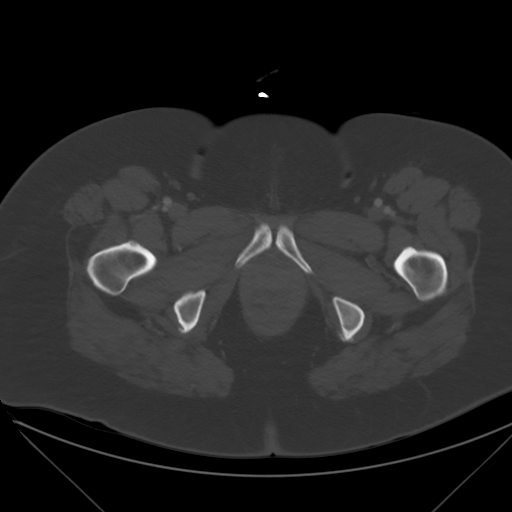
[im 14/100  soft-tissue]
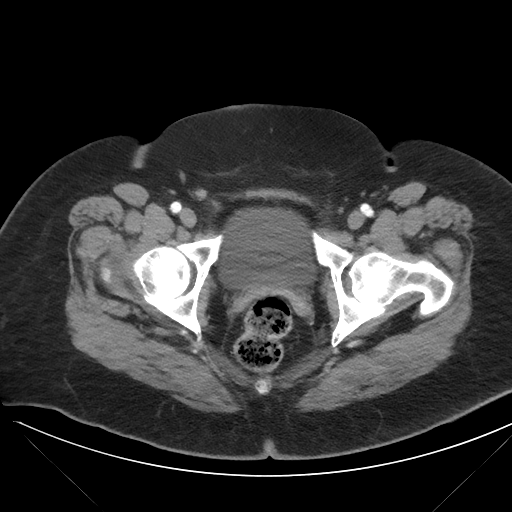
[im 20/100  soft-tissue]
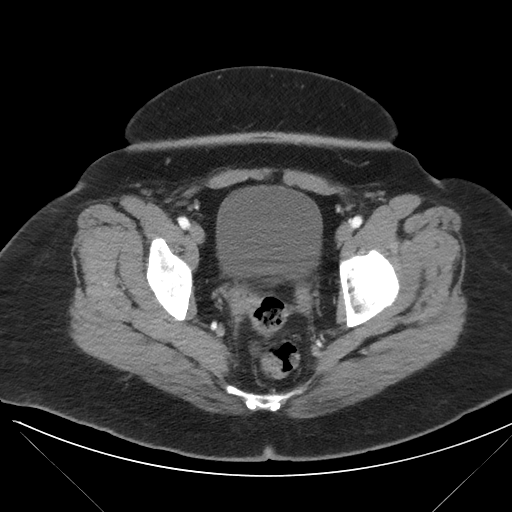
[im 27/100  soft-tissue]
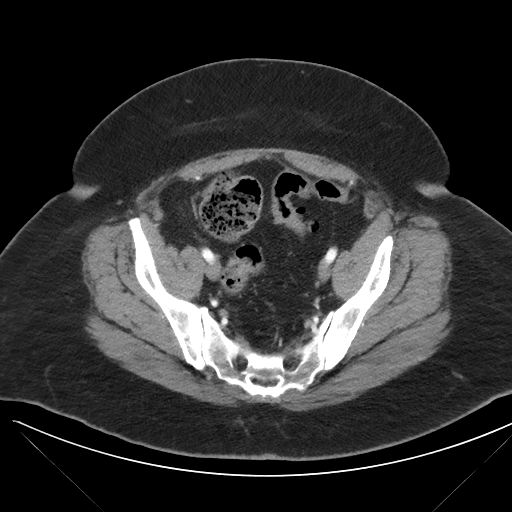
[im 34/100  soft-tissue]
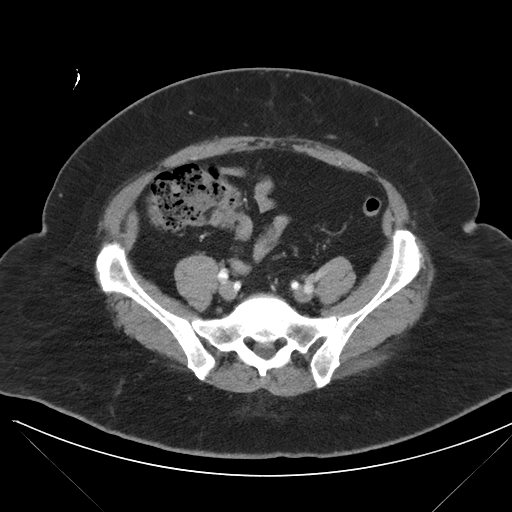
[im 40/100  soft-tissue]
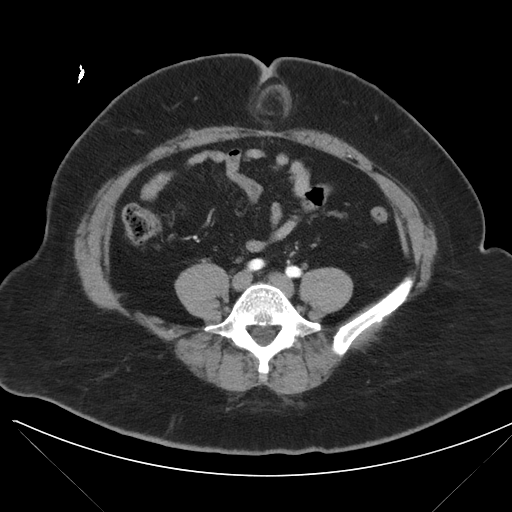
[im 47/100  soft-tissue]
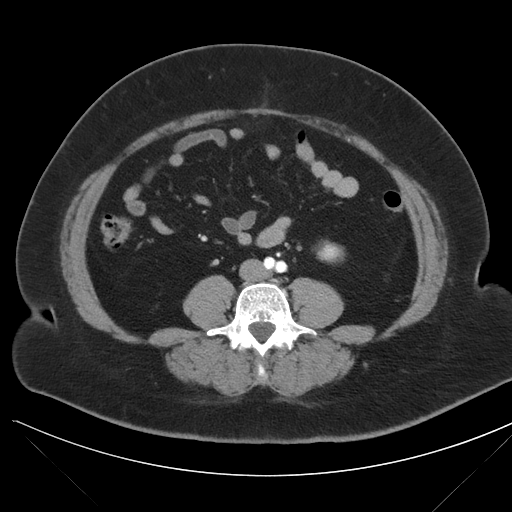
[im 53/100  soft-tissue]
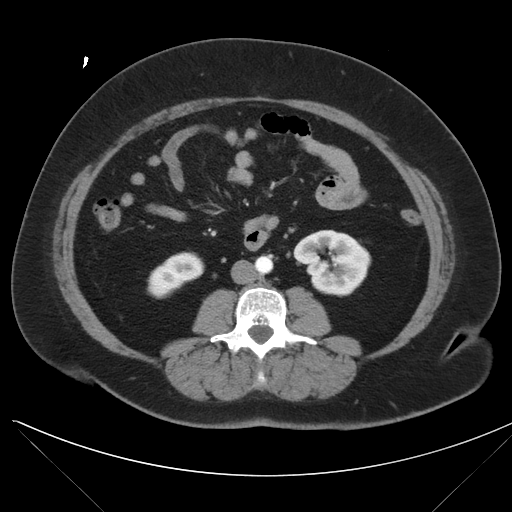
[im 60/100  soft-tissue]
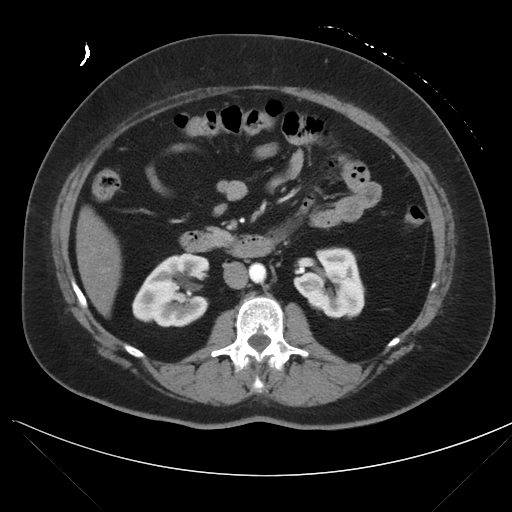
[im 60/100  bone]
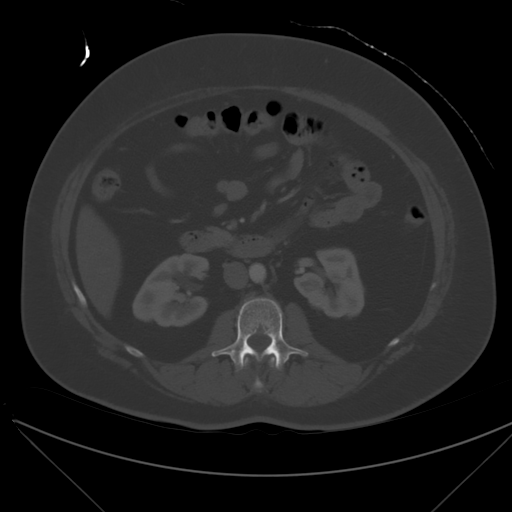
[im 67/100  soft-tissue]
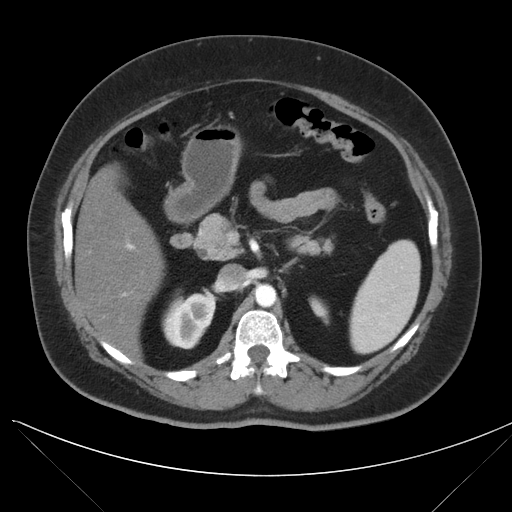
[im 73/100  soft-tissue]
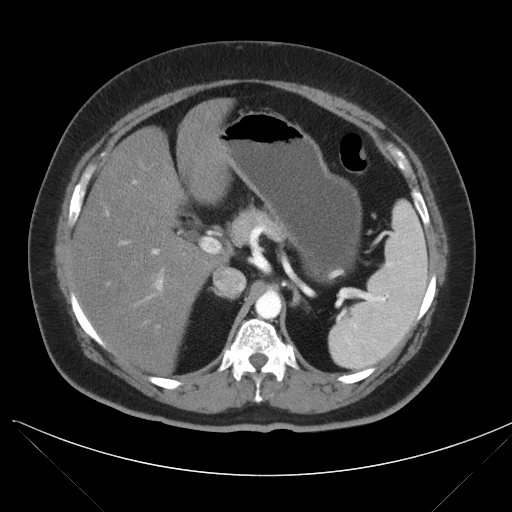
[im 80/100  soft-tissue]
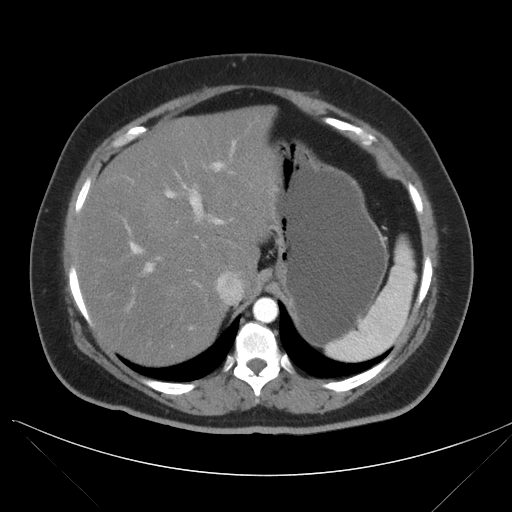
[im 86/100  soft-tissue]
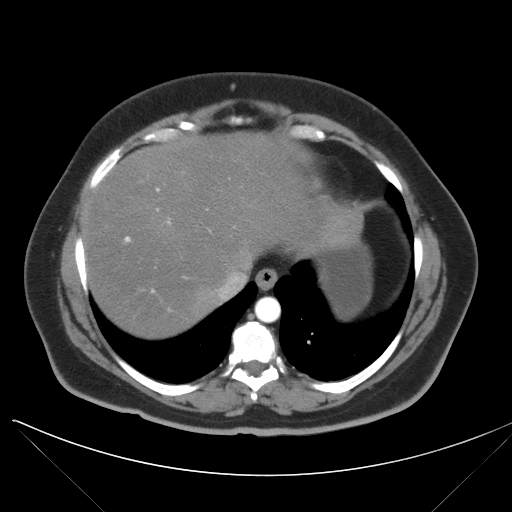
[im 93/100  soft-tissue]
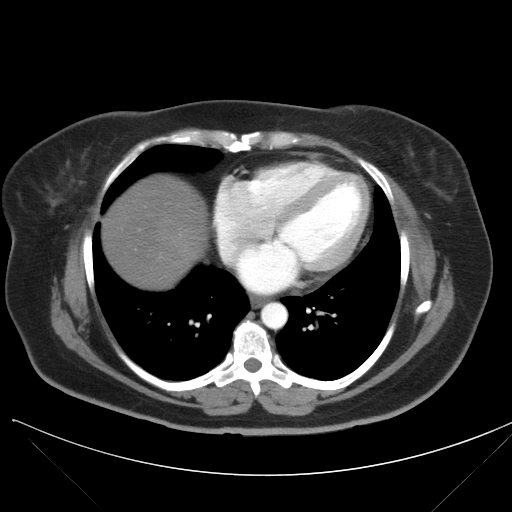

[mpr, coronals, coronal · coronal · 0.97mm/px · 3 of 100 slices shown]
[im 34/100  soft-tissue]
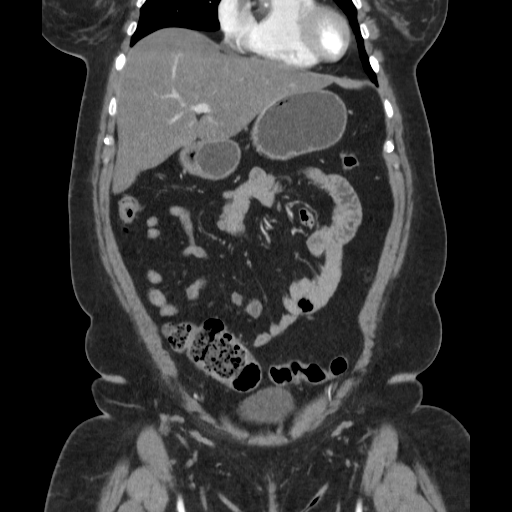
[im 45/100  soft-tissue]
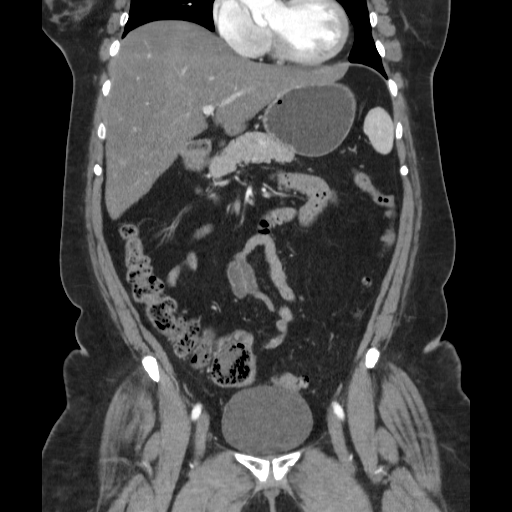
[im 56/100  soft-tissue]
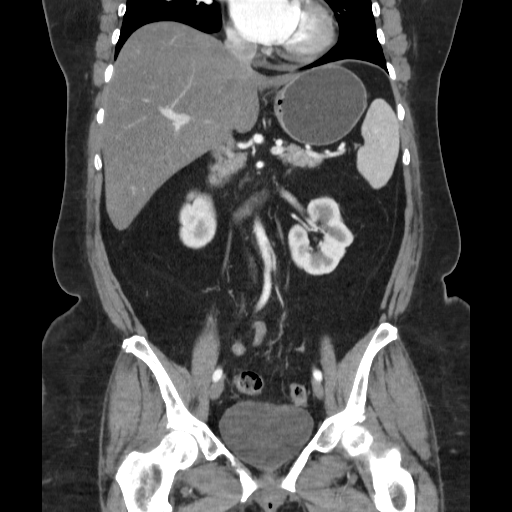

[17 of 46 positions shown; findings below may reference images not displayed]

FINDINGS: The lung bases are clear.  The liver is diffusely low
attenuation and inhomogeneous most consistent with fatty
infiltration with areas of sparing.  No ductal dilatation is seen.
The gallbladder has previously been resected.  The pancreas is
normal in size and the pancreatic duct is not dilated.  The adrenal
glands and spleen are unremarkable.  The stomach is fluid distended
and unremarkable.  The kidneys enhance with no calculus or mass and
no hydronephrosis is seen.  Abdominal aorta is normal in caliber.
No adenopathy is seen.

There is a periumbilical hernia present containing fat. There is
some strandiness of the fat which may indicate fat necrosis.  The
urinary bladder is unremarkable.  The uterus has been resected.  No
adnexal lesion is seen.  No fluid is noted within the pelvis.  The
terminal ileum is unremarkable.  No bony abnormality is seen.
IMPRESSION: 1.  No acute abnormality on CT of the abdomen and pelvis.
2.  Left periumbilical hernia containing fat with possible fat
necrosis.
3.  Fatty infiltration of the liver.

## 2011-08-18 ENCOUNTER — Other Ambulatory Visit: Payer: Self-pay | Admitting: Family Medicine

## 2011-08-18 DIAGNOSIS — R922 Inconclusive mammogram: Secondary | ICD-10-CM

## 2011-09-01 ENCOUNTER — Ambulatory Visit
Admission: RE | Admit: 2011-09-01 | Discharge: 2011-09-01 | Disposition: A | Discharge status: Home / Self Care | Payer: Federal, State, Local not specified - PPO | Source: Ambulatory Visit | Attending: Family Medicine | Admitting: Family Medicine

## 2011-09-01 DIAGNOSIS — R922 Inconclusive mammogram: Secondary | ICD-10-CM

## 2012-01-31 ENCOUNTER — Other Ambulatory Visit: Payer: Self-pay | Admitting: Family Medicine

## 2012-01-31 DIAGNOSIS — N63 Unspecified lump in unspecified breast: Secondary | ICD-10-CM

## 2012-02-16 ENCOUNTER — Encounter: Payer: Self-pay | Admitting: *Deleted

## 2012-02-16 ENCOUNTER — Ambulatory Visit
Admission: RE | Admit: 2012-02-16 | Discharge: 2012-02-16 | Disposition: A | Discharge status: Home / Self Care | Payer: Federal, State, Local not specified - PPO | Source: Ambulatory Visit | Attending: Family Medicine | Admitting: Family Medicine

## 2012-02-16 DIAGNOSIS — N63 Unspecified lump in unspecified breast: Secondary | ICD-10-CM

## 2012-04-12 ENCOUNTER — Ambulatory Visit (INDEPENDENT_AMBULATORY_CARE_PROVIDER_SITE_OTHER): Payer: Federal, State, Local not specified - PPO | Admitting: Family Medicine

## 2012-04-12 ENCOUNTER — Encounter: Payer: Self-pay | Admitting: Family Medicine

## 2012-04-12 VITALS — BP 120/68 | HR 83 | Temp 97.8°F | Ht 65.0 in | Wt 191.8 lb

## 2012-04-12 DIAGNOSIS — R7309 Other abnormal glucose: Secondary | ICD-10-CM

## 2012-04-12 DIAGNOSIS — A084 Viral intestinal infection, unspecified: Secondary | ICD-10-CM | POA: Insufficient documentation

## 2012-04-12 DIAGNOSIS — E782 Mixed hyperlipidemia: Secondary | ICD-10-CM

## 2012-04-12 DIAGNOSIS — A088 Other specified intestinal infections: Secondary | ICD-10-CM

## 2012-04-12 LAB — COMPREHENSIVE METABOLIC PANEL
AST: 17 U/L (ref 0–37)
Albumin: 4.4 g/dL (ref 3.5–5.2)
Alkaline Phosphatase: 76 U/L (ref 39–117)
BUN: 19 mg/dL (ref 6–23)
Calcium: 9.5 mg/dL (ref 8.4–10.5)
Chloride: 104 mEq/L (ref 96–112)
Glucose, Bld: 103 mg/dL — ABNORMAL HIGH (ref 70–99)
Potassium: 4 mEq/L (ref 3.5–5.1)
Sodium: 140 mEq/L (ref 135–145)
Total Protein: 7.5 g/dL (ref 6.0–8.3)

## 2012-04-12 LAB — CBC WITH DIFFERENTIAL/PLATELET
Basophils Absolute: 0 10*3/uL (ref 0.0–0.1)
Eosinophils Absolute: 0.2 10*3/uL (ref 0.0–0.7)
Lymphocytes Relative: 28.1 % (ref 12.0–46.0)
MCHC: 34.5 g/dL (ref 30.0–36.0)
MCV: 92.1 fl (ref 78.0–100.0)
Monocytes Absolute: 0.5 10*3/uL (ref 0.1–1.0)
Neutrophils Relative %: 60.3 % (ref 43.0–77.0)
Platelets: 234 10*3/uL (ref 150.0–400.0)
RBC: 4.5 Mil/uL (ref 3.87–5.11)

## 2012-04-12 LAB — LIPID PANEL
Cholesterol: 204 mg/dL — ABNORMAL HIGH (ref 0–200)
Triglycerides: 86 mg/dL (ref 0.0–149.0)

## 2012-04-12 LAB — HEMOGLOBIN A1C: Hgb A1c MFr Bld: 5.5 % (ref 4.6–6.5)

## 2012-04-12 NOTE — Patient Instructions (Addendum)
For viral gatroenteritis- goal is to keep hydrated  Take sips of fluids all day long - pedialyte/ sports drink is ok as is jello and popcicles  When you start eating- keep it bland - BRAT -- banana, rice/ applesauce and toast  If worse diarrhea or signs/ symptoms of dehydration -call and go to ER if needed  If severe abdominal pain - also call and seek care Update if not starting to improve in a week or if worsening   Labs today
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
## 2012-04-13 ENCOUNTER — Telehealth: Payer: Self-pay

## 2012-04-13 NOTE — Telephone Encounter (Signed)
Pt request recent lab results; Patient notified as instructed by telephone.  

## 2012-04-13 NOTE — Assessment & Plan Note (Signed)
Pt off statin at this time and curious to see how chol is after big diet change and 40 lb wt loss Lab today Rev low sat fat diet

## 2012-04-13 NOTE — Assessment & Plan Note (Signed)
With primarily diarrhea - disc avoidance of dehydration and symptoms to watch for  BRAT diet when ready Sips of clear fluids for now  Update if not starting to improve in a week or if worsening   Re assuring exam

## 2012-04-13 NOTE — Assessment & Plan Note (Signed)
Expect imp after 40 lb wt loss and elim of most sugar from diet a1c today

## 2012-05-07 ENCOUNTER — Ambulatory Visit: Payer: Self-pay | Admitting: Internal Medicine

## 2012-05-23 ENCOUNTER — Telehealth: Payer: Self-pay | Admitting: Family Medicine

## 2012-05-23 NOTE — Telephone Encounter (Signed)
Caller: [name]/Patient; Patient Name: Cassidy Reese; PCP: Judy Pimple.; Best Callback Phone Number: [phone]; Call regarding: UTI symptoms; pain to rt side with urination; onset 05/20/12, but worsening; denies pain, burning, urgency or frequency; afebrile; All emergent symptoms of Flank Pain protocol ruled out except "persistent flank pain not previously evaluated"; disposition see within 24hrs; appt scheduled for 05/24/12 at 0830 with Dr.Tower

## 2012-05-24 ENCOUNTER — Ambulatory Visit (INDEPENDENT_AMBULATORY_CARE_PROVIDER_SITE_OTHER)
Admission: RE | Admit: 2012-05-24 | Discharge: 2012-05-24 | Disposition: A | Discharge status: Home / Self Care | Payer: Federal, State, Local not specified - PPO | Source: Ambulatory Visit | Attending: Family Medicine | Admitting: Family Medicine

## 2012-05-24 ENCOUNTER — Ambulatory Visit (INDEPENDENT_AMBULATORY_CARE_PROVIDER_SITE_OTHER): Payer: Federal, State, Local not specified - PPO | Admitting: Family Medicine

## 2012-05-24 ENCOUNTER — Encounter: Payer: Self-pay | Admitting: Family Medicine

## 2012-05-24 VITALS — BP 124/82 | HR 82 | Temp 97.7°F | Ht 65.0 in | Wt 190.0 lb

## 2012-05-24 DIAGNOSIS — M25559 Pain in unspecified hip: Secondary | ICD-10-CM

## 2012-05-24 DIAGNOSIS — R1031 Right lower quadrant pain: Secondary | ICD-10-CM | POA: Insufficient documentation

## 2012-05-24 DIAGNOSIS — R109 Unspecified abdominal pain: Secondary | ICD-10-CM

## 2012-05-24 DIAGNOSIS — M25551 Pain in right hip: Secondary | ICD-10-CM

## 2012-05-24 LAB — POCT URINALYSIS DIPSTICK
Bilirubin, UA: NEGATIVE
Blood, UA: NEGATIVE
Glucose, UA: NEGATIVE
Ketones, UA: NEGATIVE
Spec Grav, UA: >=1.03
Urobilinogen, UA: 0.2
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
## 2012-05-24 NOTE — Patient Instructions (Addendum)
Urinalysis is normal  We will do xray of abdomen / pelvis today  Keep taking a stool softener and miralax to keep bowel movements regular Push the fluids - especially water

## 2012-05-24 NOTE — Assessment & Plan Note (Signed)
In pelvic area without other symptoms-worse with urination but not straining ua dip and spin normal  Has had hyst and appy Suspect constipation- due to recent bout with that abd xray today and update with plan Will continue stool softener or miralax and inc her fluids

## 2012-05-25 ENCOUNTER — Other Ambulatory Visit (INDEPENDENT_AMBULATORY_CARE_PROVIDER_SITE_OTHER): Payer: Federal, State, Local not specified - PPO

## 2012-05-25 ENCOUNTER — Telehealth: Payer: Self-pay | Admitting: Family Medicine

## 2012-05-25 DIAGNOSIS — R1031 Right lower quadrant pain: Secondary | ICD-10-CM

## 2012-05-25 LAB — COMPREHENSIVE METABOLIC PANEL
AST: 14 U/L (ref 0–37)
Alkaline Phosphatase: 66 U/L (ref 39–117)
BUN: 19 mg/dL (ref 6–23)
Glucose, Bld: 97 mg/dL (ref 70–99)
Sodium: 139 mEq/L (ref 135–145)
Total Bilirubin: 0.7 mg/dL (ref 0.3–1.2)

## 2012-05-25 LAB — POCT UA - MICROSCOPIC ONLY
Bacteria, U Microscopic: 0
Mucus, UA: 0
RBC, urine, microscopic: 0

## 2012-05-25 LAB — CBC WITH DIFFERENTIAL/PLATELET
Eosinophils Absolute: 0.2 10*3/uL (ref 0.0–0.7)
Eosinophils Relative: 3.9 % (ref 0.0–5.0)
HCT: 39 % (ref 36.0–46.0)
Lymphs Abs: 1.8 10*3/uL (ref 0.7–4.0)
MCHC: 33.4 g/dL (ref 30.0–36.0)
MCV: 93.6 fl (ref 78.0–100.0)
Monocytes Absolute: 0.4 10*3/uL (ref 0.1–1.0)
Platelets: 235 10*3/uL (ref 150.0–400.0)
WBC: 6.1 10*3/uL (ref 4.5–10.5)

## 2012-05-25 NOTE — Telephone Encounter (Signed)
Patient coming in today to have labs done

## 2012-05-25 NOTE — Telephone Encounter (Signed)
Message copied by Judy Pimple on Thu May 25, 2012  9:57 AM ------      Message from: [place], [place]      Created: Thu May 25, 2012  8:58 AM       Spoke to patient she stated that she was awaked by the pain on last night, she said whatever you think is best she will do.

## 2012-05-25 NOTE — Telephone Encounter (Signed)
Thanks - I am going to order an abdominal and pelvic ultrasound  Also labs - please sched lab appt If pain becomes severe please go to ER Let me know if fever/ stool changes or other symptoms

## 2012-05-29 ENCOUNTER — Other Ambulatory Visit: Payer: Self-pay | Admitting: Family Medicine

## 2012-05-30 ENCOUNTER — Telehealth: Payer: Self-pay

## 2012-05-30 ENCOUNTER — Ambulatory Visit: Payer: Self-pay | Admitting: Family Medicine

## 2012-05-30 DIAGNOSIS — R1031 Right lower quadrant pain: Secondary | ICD-10-CM

## 2012-05-30 NOTE — Telephone Encounter (Signed)
Let her know I am ordering CT right now -thanks

## 2012-05-30 NOTE — Telephone Encounter (Signed)
Dr Milinda Antis said pt can go home; Dr Milinda Antis will decide what testing to be done and pt will be notified. If pt's condition changes or worsens pt to call out office. Aundra Millet will notify pt.

## 2012-05-30 NOTE — Telephone Encounter (Signed)
Cassidy Reese with [facility] [place] said pt is there and pt does not want abdominal general/pelvis if that is not going to diagnosis hernia. Pt having rt lower quadrant pain ? Inguinal hernia. Pt said Rt lower quadrant pain going down rt leg; pt having trouble lifting leg also. Cassidy Reese suggest possible CT to dx hernia.Please advise.

## 2012-05-30 NOTE — Telephone Encounter (Signed)
Pt called; yesterday pt lifted a beach chair and tent; afterwards was not able to walk up steps due to pain in rt groin; walking on flat surface also very painful; after laying in bed all night;no pain now but pt is going on 2 week cruise in 6 weeks and wants resolved before goes on cruise. Pt is off work today and would like CT done today if possible; pt has to do a lot of lifting at work also(pt is not working today). Pt contact # [phone].

## 2012-05-31 ENCOUNTER — Telehealth: Payer: Self-pay

## 2012-05-31 ENCOUNTER — Ambulatory Visit (INDEPENDENT_AMBULATORY_CARE_PROVIDER_SITE_OTHER)
Admission: RE | Admit: 2012-05-31 | Discharge: 2012-05-31 | Disposition: A | Discharge status: Home / Self Care | Payer: Federal, State, Local not specified - PPO | Source: Ambulatory Visit | Attending: Family Medicine | Admitting: Family Medicine

## 2012-05-31 DIAGNOSIS — R1031 Right lower quadrant pain: Secondary | ICD-10-CM

## 2012-05-31 MED ORDER — IOHEXOL 300 MG/ML  SOLN
100.0000 mL | Freq: Once | INTRAMUSCULAR | Status: AC | PRN
  Administered 2012-05-31: 100 mL via INTRAVENOUS

## 2012-05-31 NOTE — Telephone Encounter (Signed)
Pt had CT scan today, pt having more upper and lower abdominal pain since had CT; pt said having a lot of gas rolling in stomach; Pain level now is 7.No N&V and no back pain. Pt request what can do for abdominal pain and gas since the CT done. Cassidy Reese. Pt request office notes and CT scans sent to Dr Kinnie Scales has appt with Dr Kinnie Scales on 06/06/12.

## 2012-05-31 NOTE — Telephone Encounter (Signed)
CT scheduled at [facility] pt aware.

## 2012-05-31 NOTE — Telephone Encounter (Signed)
See above

## 2012-05-31 NOTE — Telephone Encounter (Signed)
Follow up with Dr Troy Sine as planned-= keep that appt  Get gas - ex today and take as directed, avoid any carbonated beverages or gassy foods like beans (until improved stick with bland diet)  If severe pain after hours-go to ER  I am reassured that CT looked ok Keep me updated

## 2012-06-02 NOTE — Telephone Encounter (Signed)
Patient notified as instructed. 

## 2012-09-11 ENCOUNTER — Other Ambulatory Visit: Payer: Self-pay | Admitting: *Deleted

## 2012-09-11 MED ORDER — OMEPRAZOLE-SODIUM BICARBONATE 40-1100 MG PO CAPS: 1.0000 | ORAL_CAPSULE | Freq: Every day | ORAL | Status: DC

## 2012-09-28 IMAGING — CR DG ABDOMEN 1V
2 series · 2 of 2 positions shown · non-contrast
Comparison: 03/12/2011 CT.

CLINICAL DATA: Right lower quadrant pain.

ABDOMEN - 1 VIEW

[view not recorded (1 of 2)]
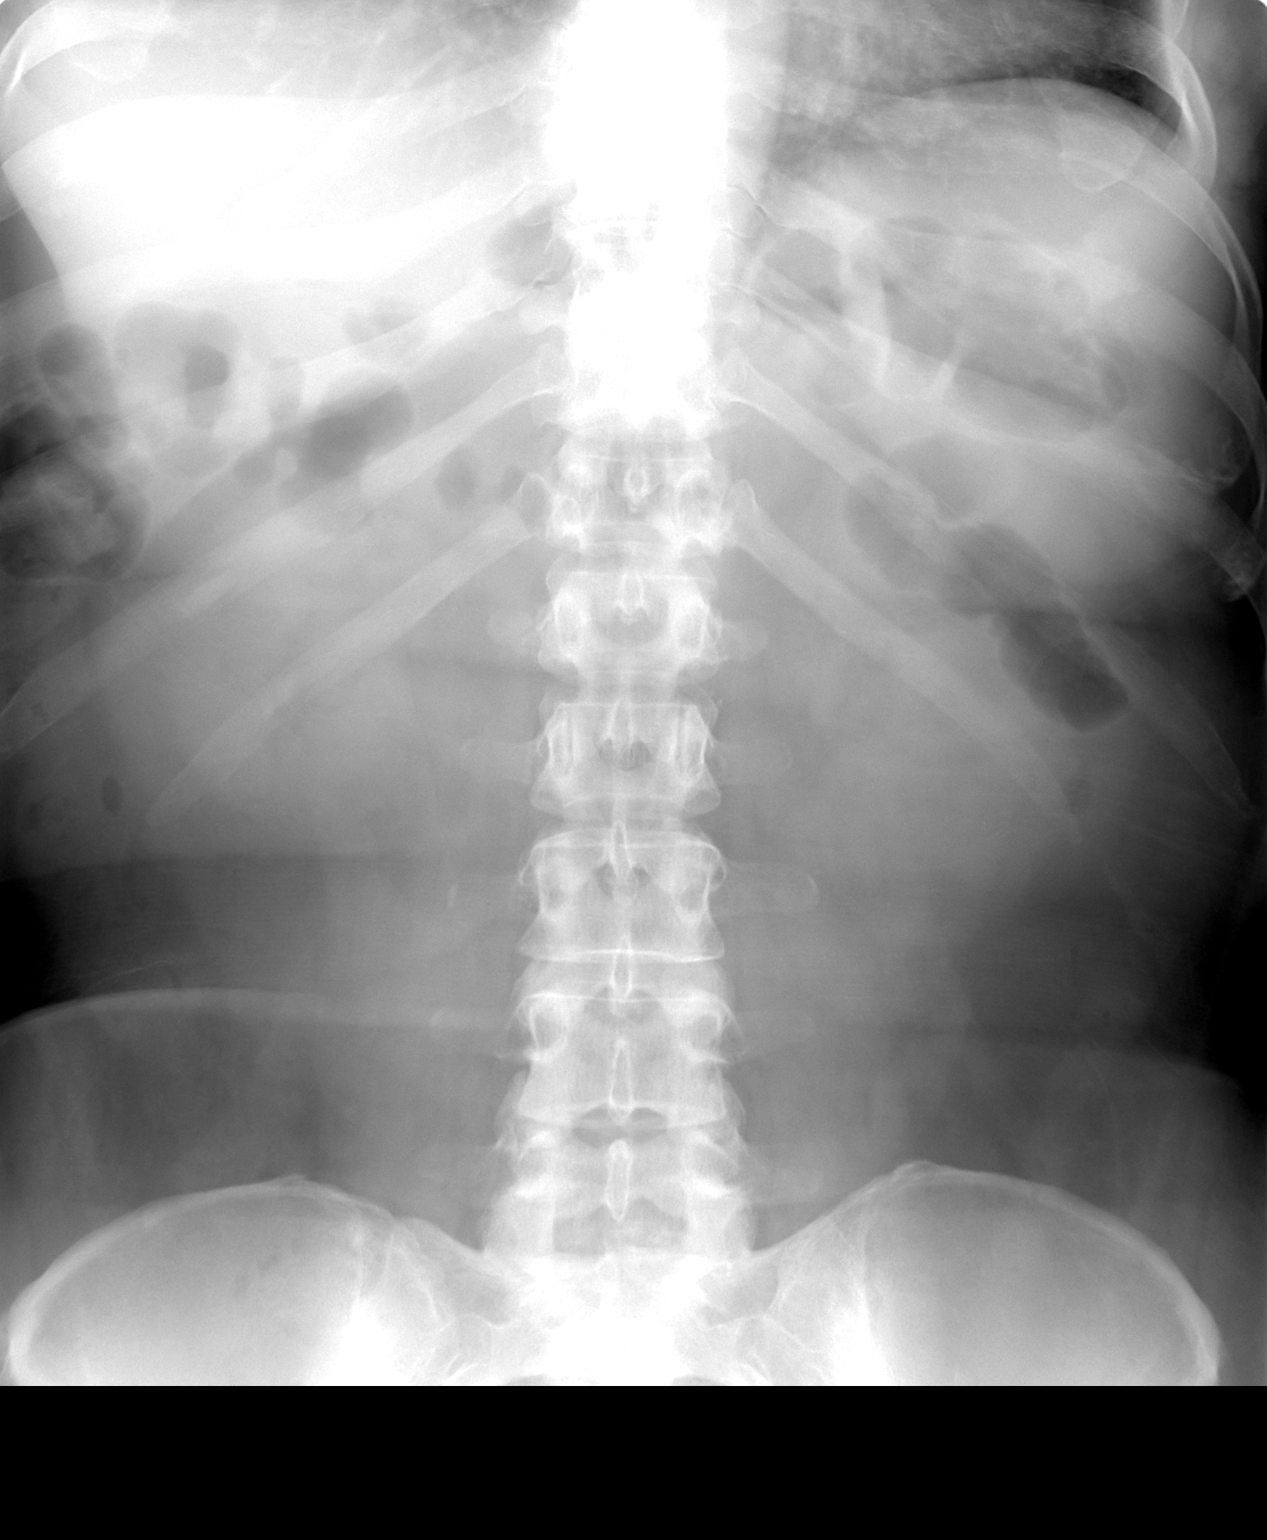

[view not recorded (2 of 2)]
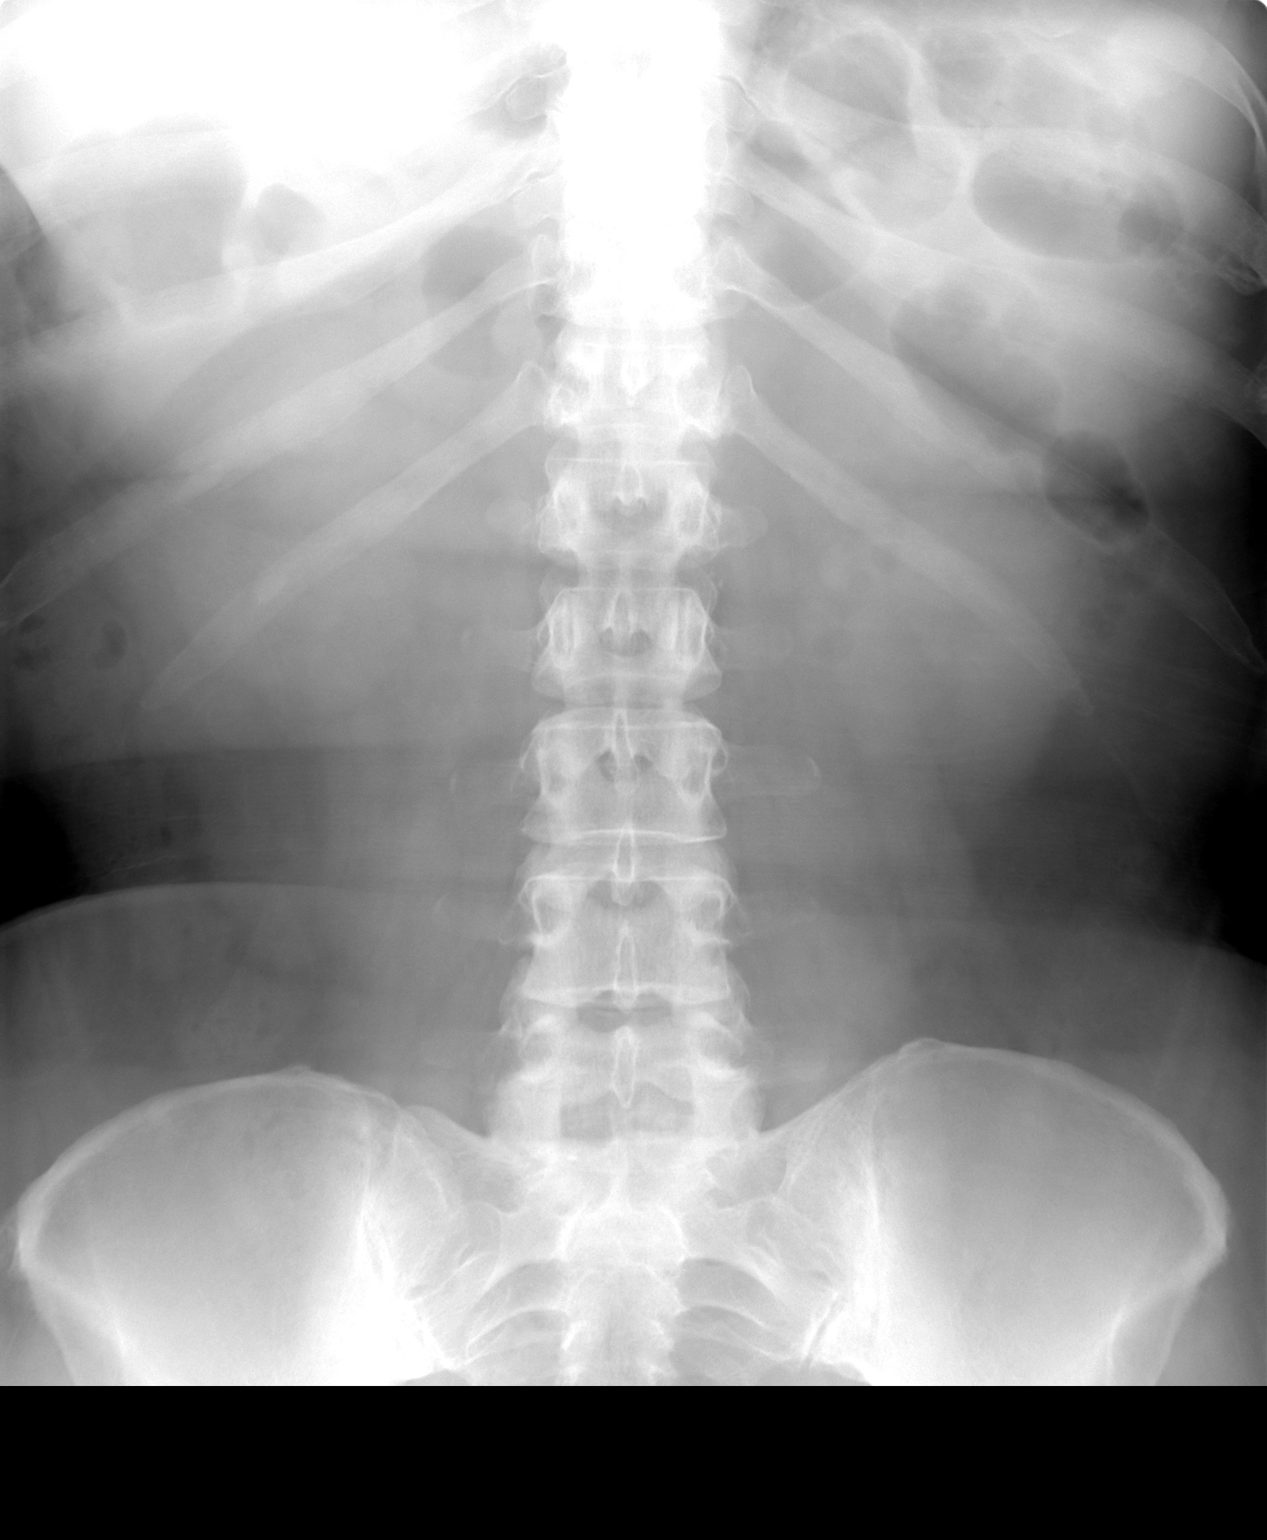

[2 of 2 positions shown; findings below may reference images not displayed]

FINDINGS: Nonspecific bowel gas pattern.  Small bowel loop left
upper quadrant gas filled and top normal in size.  Gas filled
normal sized transverse colon.  No plain film evidence of bowel
obstruction.

The possibility of free intraperitoneal air cannot be addressed on
a supine view..
IMPRESSION: Nonspecific bowel gas pattern as detailed above.

## 2012-12-29 ENCOUNTER — Telehealth: Payer: Self-pay | Admitting: Family Medicine

## 2012-12-29 NOTE — Telephone Encounter (Signed)
Patient Information:  Caller Name: [place]  Phone: [phone]  Patient: Cassidy Reese, Cassidy Reese  Gender: Female  DOB: Feb 27, 1956  Age: 57 Years  PCP: Roxy Manns [facility])  Pregnant: No  Office Follow Up:  Does the office need to follow up with this patient?: No  Instructions For The Office: N/A   Symptoms  Reason For Call & Symptoms: she states she has a Knot above her right breast. IT IS NOT ON the breast. Knot is has some softness to it. Size is not round but long.- 1 inch x 2 inches.  No redness or warmth. No pain.   Onset today. No fever or illness.  She states when she hangs her arm down she can see it.  When she raises arm up the knot disappears.   Does occasional breast exams.  Recent 50 lbs weight loss with diet changes.   Reviewed Health History In EMR: Yes  Reviewed Medications In EMR: Yes  Reviewed Allergies In EMR: Yes  Reviewed Surgeries / Procedures: Yes  Date of Onset of Symptoms: 12/29/2012 OB / GYN:  LMP: Unknown  Guideline(s) Used:  Skin Lesion - Moles or Growths  Disposition Per Guideline:   See Within 2 Weeks in Office  Reason For Disposition Reached:   Caller is uncertain what it is  Advice Given:  Monthly Skin Self-examination:  Some doctors recommend that you perform a skin self-examination once a month.  Stand in front of a mirror. Examine every inch of your skin for new moles or changes in old ones.  Call Back If:  Fever or pain occurs  Any change in the mole or growth  You become worse.  Patient Will Follow Care Advice:  YES  Appointment Scheduled:  01/01/2013 14:00:00 Appointment Scheduled Provider:  Roxy Manns [facility])

## 2012-12-29 NOTE — Telephone Encounter (Signed)
Will see her then 

## 2013-01-01 ENCOUNTER — Ambulatory Visit: Payer: Self-pay | Admitting: Family Medicine

## 2013-01-08 ENCOUNTER — Other Ambulatory Visit: Payer: Self-pay

## 2013-01-08 DIAGNOSIS — Z1159 Encounter for screening for other viral diseases: Secondary | ICD-10-CM

## 2013-02-15 ENCOUNTER — Telehealth: Payer: Self-pay

## 2013-02-15 NOTE — Telephone Encounter (Signed)
Prior auth needed Zegerid; form on Dr [employer] shelf.

## 2013-02-16 NOTE — Telephone Encounter (Signed)
Done and in IN box 

## 2013-02-16 NOTE — Telephone Encounter (Signed)
Prior auth faxed

## 2013-02-21 ENCOUNTER — Ambulatory Visit
Admission: RE | Admit: 2013-02-21 | Discharge: 2013-02-21 | Disposition: A | Discharge status: Home / Self Care | Payer: Federal, State, Local not specified - PPO | Source: Ambulatory Visit

## 2013-02-21 DIAGNOSIS — Z1159 Encounter for screening for other viral diseases: Secondary | ICD-10-CM

## 2013-02-21 NOTE — Telephone Encounter (Signed)
Approval letter for Zegerid on Dr Royden Purl shelf for signature and scanning.

## 2013-02-22 ENCOUNTER — Encounter: Payer: Self-pay | Admitting: *Deleted

## 2013-09-12 ENCOUNTER — Emergency Department: Payer: Self-pay | Admitting: Emergency Medicine

## 2013-09-12 ENCOUNTER — Ambulatory Visit: Payer: Self-pay | Admitting: Family Medicine

## 2013-09-12 LAB — CBC
HGB: 13.5 g/dL (ref 12.0–16.0)
MCH: 31 pg (ref 26.0–34.0)
MCHC: 34.1 g/dL (ref 32.0–36.0)
MCV: 91 fL (ref 80–100)
Platelet: 213 10*3/uL (ref 150–440)
RBC: 4.36 10*6/uL (ref 3.80–5.20)
RDW: 13.1 % (ref 11.5–14.5)
WBC: 7.3 10*3/uL (ref 3.6–11.0)

## 2013-09-12 LAB — BASIC METABOLIC PANEL
BUN: 25 mg/dL — ABNORMAL HIGH (ref 7–18)
Calcium, Total: 9.2 mg/dL (ref 8.5–10.1)
Chloride: 108 mmol/L — ABNORMAL HIGH (ref 98–107)
Co2: 30 mmol/L (ref 21–32)
EGFR (African American): >60
Glucose: 131 mg/dL — ABNORMAL HIGH (ref 65–99)
Osmolality: 287 (ref 275–301)
Potassium: 3.6 mmol/L (ref 3.5–5.1)
Sodium: 141 mmol/L (ref 136–145)

## 2013-09-12 LAB — TROPONIN I: Troponin-I: <0.02 ng/mL

## 2013-09-12 LAB — PRO B NATRIURETIC PEPTIDE: B-Type Natriuretic Peptide: 19 pg/mL (ref 0–125)

## 2013-09-13 ENCOUNTER — Other Ambulatory Visit: Payer: Self-pay | Admitting: Family Medicine

## 2013-09-14 NOTE — Telephone Encounter (Signed)
Electronic refill request, no recent/future appt. Please advise  

## 2013-09-16 NOTE — Telephone Encounter (Signed)
She has appt to see me in April I will electronically refill until then

## 2013-09-21 ENCOUNTER — Ambulatory Visit (INDEPENDENT_AMBULATORY_CARE_PROVIDER_SITE_OTHER): Payer: Federal, State, Local not specified - PPO | Admitting: Internal Medicine

## 2013-09-21 ENCOUNTER — Encounter: Payer: Self-pay | Admitting: Internal Medicine

## 2013-09-21 ENCOUNTER — Encounter (INDEPENDENT_AMBULATORY_CARE_PROVIDER_SITE_OTHER): Payer: Self-pay

## 2013-09-21 VITALS — BP 130/76 | HR 75 | Ht 65.0 in | Wt 210.0 lb

## 2013-09-21 DIAGNOSIS — R079 Chest pain, unspecified: Secondary | ICD-10-CM

## 2013-09-21 NOTE — Patient Instructions (Signed)
Your physician recommends that you schedule a follow-up appointment in: AS NEEDED  
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
## 2014-01-23 ENCOUNTER — Other Ambulatory Visit: Payer: Federal, State, Local not specified - PPO

## 2014-01-30 ENCOUNTER — Encounter: Payer: Federal, State, Local not specified - PPO | Admitting: Family Medicine

## 2014-04-01 ENCOUNTER — Other Ambulatory Visit: Payer: Self-pay

## 2014-04-01 DIAGNOSIS — Z1231 Encounter for screening mammogram for malignant neoplasm of breast: Secondary | ICD-10-CM

## 2014-04-16 ENCOUNTER — Ambulatory Visit
Admission: RE | Admit: 2014-04-16 | Discharge: 2014-04-16 | Disposition: A | Discharge status: Home / Self Care | Payer: Federal, State, Local not specified - PPO | Source: Ambulatory Visit

## 2014-04-16 DIAGNOSIS — Z1231 Encounter for screening mammogram for malignant neoplasm of breast: Secondary | ICD-10-CM

## 2014-04-18 ENCOUNTER — Encounter: Payer: Self-pay | Admitting: *Deleted

## 2014-04-23 ENCOUNTER — Telehealth: Payer: Self-pay | Admitting: Family Medicine

## 2014-04-23 DIAGNOSIS — Z0000 Encounter for general adult medical examination without abnormal findings: Secondary | ICD-10-CM

## 2014-04-23 DIAGNOSIS — R7309 Other abnormal glucose: Secondary | ICD-10-CM

## 2014-04-23 NOTE — Telephone Encounter (Signed)
Message copied by Judy PimpleWER, Tongela Encinas A on Tue Apr 23, 2014  9:33 PM ------      Message from: Alvina ChouWALSH, TERRI J      Created: Thu Apr 18, 2014  3:11 PM      Regarding: Lab orders for Wednesday, 7.22.15       Patient is scheduled for CPX labs, please order future labs, Thanks , Terri       ------

## 2014-04-24 ENCOUNTER — Other Ambulatory Visit (INDEPENDENT_AMBULATORY_CARE_PROVIDER_SITE_OTHER): Payer: Federal, State, Local not specified - PPO

## 2014-04-24 DIAGNOSIS — R7309 Other abnormal glucose: Secondary | ICD-10-CM

## 2014-04-24 DIAGNOSIS — Z0000 Encounter for general adult medical examination without abnormal findings: Secondary | ICD-10-CM

## 2014-04-24 DIAGNOSIS — E782 Mixed hyperlipidemia: Secondary | ICD-10-CM

## 2014-04-24 LAB — CBC WITH DIFFERENTIAL/PLATELET
BASOS PCT: 0.3 % (ref 0.0–3.0)
Basophils Absolute: 0 10*3/uL (ref 0.0–0.1)
EOS ABS: 0.3 10*3/uL (ref 0.0–0.7)
EOS PCT: 4.3 % (ref 0.0–5.0)
HCT: 39.2 % (ref 36.0–46.0)
Hemoglobin: 13.6 g/dL (ref 12.0–15.0)
Lymphocytes Relative: 30.6 % (ref 12.0–46.0)
Lymphs Abs: 1.9 10*3/uL (ref 0.7–4.0)
MCHC: 34.6 g/dL (ref 30.0–36.0)
MCV: 91.9 fl (ref 78.0–100.0)
MONOS PCT: 7.6 % (ref 3.0–12.0)
Monocytes Absolute: 0.5 10*3/uL (ref 0.1–1.0)
NEUTROS PCT: 57.2 % (ref 43.0–77.0)
Neutro Abs: 3.5 10*3/uL (ref 1.4–7.7)
Platelets: 234 10*3/uL (ref 150.0–400.0)
RBC: 4.26 Mil/uL (ref 3.87–5.11)
RDW: 13.1 % (ref 11.5–15.5)
WBC: 6.1 10*3/uL (ref 4.0–10.5)

## 2014-04-24 LAB — COMPREHENSIVE METABOLIC PANEL
ALBUMIN: 4 g/dL (ref 3.5–5.2)
ALK PHOS: 53 U/L (ref 39–117)
ALT: 21 U/L (ref 0–35)
AST: 16 U/L (ref 0–37)
BILIRUBIN TOTAL: 0.9 mg/dL (ref 0.2–1.2)
BUN: 25 mg/dL — ABNORMAL HIGH (ref 6–23)
CO2: 26 meq/L (ref 19–32)
Calcium: 9.2 mg/dL (ref 8.4–10.5)
Chloride: 108 mEq/L (ref 96–112)
Creatinine, Ser: 0.8 mg/dL (ref 0.4–1.2)
GFR: 75.61 mL/min (ref >60.00)
Glucose, Bld: 139 mg/dL — ABNORMAL HIGH (ref 70–99)
Potassium: 4.3 mEq/L (ref 3.5–5.1)
SODIUM: 142 meq/L (ref 135–145)
Total Protein: 6.6 g/dL (ref 6.0–8.3)

## 2014-04-24 LAB — LIPID PANEL
CHOL/HDL RATIO: 4
Cholesterol: 187 mg/dL (ref 0–200)
HDL: 51.1 mg/dL (ref >39.00)
LDL Cholesterol: 116 mg/dL — ABNORMAL HIGH (ref 0–99)
NonHDL: 135.9
Triglycerides: 101 mg/dL (ref 0.0–149.0)
VLDL: 20.2 mg/dL (ref 0.0–40.0)

## 2014-04-24 LAB — TSH: TSH: 0.82 u[IU]/mL (ref 0.35–4.50)

## 2014-04-24 LAB — HEMOGLOBIN A1C: Hgb A1c MFr Bld: 5.6 % (ref 4.6–6.5)

## 2014-05-01 ENCOUNTER — Encounter: Payer: Self-pay | Admitting: Family Medicine

## 2014-05-01 ENCOUNTER — Ambulatory Visit (INDEPENDENT_AMBULATORY_CARE_PROVIDER_SITE_OTHER): Payer: Federal, State, Local not specified - PPO | Admitting: Family Medicine

## 2014-05-01 VITALS — BP 128/72 | HR 86 | Temp 99.3°F | Ht 64.25 in | Wt 214.5 lb

## 2014-05-01 DIAGNOSIS — J020 Streptococcal pharyngitis: Secondary | ICD-10-CM

## 2014-05-01 DIAGNOSIS — J029 Acute pharyngitis, unspecified: Secondary | ICD-10-CM

## 2014-05-01 LAB — POCT RAPID STREP A (OFFICE): Rapid Strep A Screen: POSITIVE — AB

## 2014-05-01 MED ORDER — AMOXICILLIN 500 MG PO TABS: 500.0000 mg | ORAL_TABLET | Freq: Two times a day (BID) | ORAL | Status: DC

## 2014-05-01 NOTE — Progress Notes (Signed)
Pre visit review using our clinic review tool, if applicable. No additional management support is needed unless otherwise documented below in the visit note. 
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
## 2014-05-01 NOTE — Assessment & Plan Note (Signed)
Pos RST with ST and ha and low grade temp  tx with amox  Can return to work in 24 hours if better Disc symptomatic care - see instructions on AVS

## 2014-05-01 NOTE — Patient Instructions (Signed)
Take amoxicillin twice daily for 10 days as directed  You have to stay out of work for 1 day  Drink fluids and rest  Chloraseptic throat spray is helpful for sore throat , also tylenol  If symptoms worsen - let me know

## 2014-05-17 ENCOUNTER — Encounter: Payer: Self-pay | Admitting: Family Medicine

## 2014-05-17 ENCOUNTER — Ambulatory Visit (INDEPENDENT_AMBULATORY_CARE_PROVIDER_SITE_OTHER): Payer: Federal, State, Local not specified - PPO | Admitting: Family Medicine

## 2014-05-17 VITALS — BP 125/75 | HR 75 | Temp 98.8°F | Ht 64.5 in | Wt 214.5 lb

## 2014-05-17 DIAGNOSIS — F430 Acute stress reaction: Secondary | ICD-10-CM

## 2014-05-17 DIAGNOSIS — R7309 Other abnormal glucose: Secondary | ICD-10-CM

## 2014-05-17 DIAGNOSIS — Z0000 Encounter for general adult medical examination without abnormal findings: Secondary | ICD-10-CM

## 2014-05-17 DIAGNOSIS — E669 Obesity, unspecified: Secondary | ICD-10-CM

## 2014-05-17 DIAGNOSIS — E782 Mixed hyperlipidemia: Secondary | ICD-10-CM

## 2014-05-17 MED ORDER — OMEPRAZOLE-SODIUM BICARBONATE 40-1100 MG PO CAPS: ORAL_CAPSULE | ORAL | Status: DC

## 2014-05-17 MED ORDER — ALPRAZOLAM 0.5 MG PO TABS: 0.5000 mg | ORAL_TABLET | Freq: Every evening | ORAL | Status: DC | PRN

## 2014-05-17 NOTE — Assessment & Plan Note (Signed)
Reviewed health habits including diet and exercise and skin cancer prevention Reviewed appropriate screening tests for age  Also reviewed health mt list, fam hx and immunization status , as well as social and family history   See HPI Labs reviewed  utd imms

## 2014-05-17 NOTE — Assessment & Plan Note (Signed)
Disc goals for lipids and reasons to control them Rev labs with pt Rev low sat fat diet in detail  Improved this draw

## 2014-05-17 NOTE — Patient Instructions (Signed)
Take xanax at night if you need it (use caution) Take care of yourself  Labs look good Blood pressure improved on the 2nd check  You had the Tdap in 2012 (you are covered for pertussis)

## 2014-05-17 NOTE — Assessment & Plan Note (Signed)
Lab Results  Component Value Date   HGBA1C 5.6 04/24/2014   Stable Continue low glycemic diet-add exercise

## 2014-05-17 NOTE — Assessment & Plan Note (Signed)
Discussed how this problem influences overall health and the risks it imposes  Reviewed plan for weight loss with lower calorie diet (via better food choices and also portion control or program like weight watchers) and exercise building up to or more than 30 minutes 5 days per week including some aerobic activity   Will work on this when she is able

## 2014-05-17 NOTE — Assessment & Plan Note (Signed)
Daughter had premature birth and is in hosp with bowel obst  Given xanax for sleep prn with caution  Offered counseling if she needs it Good coping skills -will follow

## 2014-05-17 NOTE — Progress Notes (Signed)
Pre visit review using our clinic review tool, if applicable. No additional management support is needed unless otherwise documented below in the visit note. 
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
## 2014-07-09 ENCOUNTER — Ambulatory Visit (INDEPENDENT_AMBULATORY_CARE_PROVIDER_SITE_OTHER): Payer: Federal, State, Local not specified - PPO | Admitting: Family Medicine

## 2014-07-09 ENCOUNTER — Encounter: Payer: Self-pay | Admitting: Family Medicine

## 2014-07-09 VITALS — BP 124/76 | HR 96 | Temp 98.7°F | Ht 64.5 in | Wt 223.2 lb

## 2014-07-09 DIAGNOSIS — W57XXXA Bitten or stung by nonvenomous insect and other nonvenomous arthropods, initial encounter: Secondary | ICD-10-CM

## 2014-07-09 DIAGNOSIS — T148 Other injury of unspecified body region: Secondary | ICD-10-CM

## 2014-07-09 DIAGNOSIS — L089 Local infection of the skin and subcutaneous tissue, unspecified: Secondary | ICD-10-CM

## 2014-07-09 MED ORDER — AMOXICILLIN-POT CLAVULANATE 875-125 MG PO TABS: 1.0000 | ORAL_TABLET | Freq: Two times a day (BID) | ORAL | Status: DC

## 2014-07-09 NOTE — Progress Notes (Signed)
Pre visit review using our clinic review tool, if applicable. No additional management support is needed unless otherwise documented below in the visit note. 

## 2014-07-09 NOTE — Patient Instructions (Signed)
Take the augmentin as directed  Use warm compresses  Wash with soap and water  Antibiotic ointment with loose band aid is ok  Follow up with me Friday or Monday for a re check   If worse/pain /increased size -please alert me
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
## 2014-07-09 NOTE — Assessment & Plan Note (Signed)
Suspect this is a bug bite - L buttock - happened in [place] on a cruise ship  approx 1 cm with small area of ecchymosis/ necrosis in center- firm and not draining  tx with augmentin and warm compresses Disc care / hygiene  F/u later this wk for re check  If worse- earlier

## 2014-07-12 ENCOUNTER — Encounter: Payer: Self-pay | Admitting: Family Medicine

## 2014-07-12 ENCOUNTER — Ambulatory Visit (INDEPENDENT_AMBULATORY_CARE_PROVIDER_SITE_OTHER): Payer: Federal, State, Local not specified - PPO | Admitting: Family Medicine

## 2014-07-12 VITALS — BP 116/74 | HR 75 | Temp 98.8°F | Ht 64.5 in | Wt 225.0 lb

## 2014-07-12 DIAGNOSIS — L089 Local infection of the skin and subcutaneous tissue, unspecified: Secondary | ICD-10-CM

## 2014-07-12 DIAGNOSIS — T148 Other injury of unspecified body region: Secondary | ICD-10-CM

## 2014-07-12 DIAGNOSIS — W57XXXA Bitten or stung by nonvenomous insect and other nonvenomous arthropods, initial encounter: Secondary | ICD-10-CM
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
## 2014-07-12 NOTE — Assessment & Plan Note (Signed)
Induration is much improved and no abscess formation  Scab present / slt tender  Will continue augmentin and abx oint and warm compresses Update next week Sooner if worse

## 2014-07-12 NOTE — Patient Instructions (Signed)
The swelling from your bite is much better  Otherwise looks the same Continue antibiotic and ointment and warm compresses  Update me next week Earlier if worse

## 2014-07-12 NOTE — Progress Notes (Signed)
Pre visit review using our clinic review tool, if applicable. No additional management support is needed unless otherwise documented below in the visit note. 

## 2014-08-28 ENCOUNTER — Telehealth: Payer: Self-pay | Admitting: *Deleted

## 2014-08-28 NOTE — Telephone Encounter (Signed)
PA form received and placed in your inbox 

## 2014-09-02 NOTE — Telephone Encounter (Signed)
I do not have a lot of info to answer questions- does she have complications of GERD? Has she failed other meds? Thanks-in IN box

## 2014-09-15 ENCOUNTER — Ambulatory Visit: Payer: Self-pay

## 2014-10-08 NOTE — Telephone Encounter (Signed)
Pt left v/m pt request cb about prior auth for Zegerid.

## 2014-10-10 NOTE — Telephone Encounter (Signed)
Spoke with insurance and they will not cover Zegrid at all unless pt has tried and failed prilosec/omeprazole (all strengths are covered under ins), that's why PA wasn't approved. Called pt and no answer so left voicemail requesting pt to call back and let us know if she has tried prilosec/omeprazole

## 2014-10-10 NOTE — Telephone Encounter (Signed)
Pt left v/m requesting cb about prior auth for Zegerid; pt request cb today.

## 2014-10-11 NOTE — Telephone Encounter (Signed)
Spoke with pt and she has tried both doses of omeprazole and they didn't work, PA requested again

## 2014-10-15 NOTE — Telephone Encounter (Signed)
PA was approved, pt and pharmacy notified letter placed in Dr. Royden Purlower's inbox for signing and scanning

## 2015-02-18 ENCOUNTER — Ambulatory Visit: Payer: Federal, State, Local not specified - PPO | Admitting: Family Medicine

## 2015-03-04 ENCOUNTER — Ambulatory Visit (INDEPENDENT_AMBULATORY_CARE_PROVIDER_SITE_OTHER): Payer: Federal, State, Local not specified - PPO | Admitting: Family Medicine

## 2015-03-04 ENCOUNTER — Encounter: Payer: Self-pay | Admitting: Family Medicine

## 2015-03-04 VITALS — BP 130/80 | HR 87 | Temp 98.7°F | Ht 64.5 in | Wt 229.5 lb

## 2015-03-04 DIAGNOSIS — J209 Acute bronchitis, unspecified: Secondary | ICD-10-CM | POA: Diagnosis not present

## 2015-03-04 MED ORDER — AZITHROMYCIN 250 MG PO TABS: ORAL_TABLET | ORAL | Status: DC

## 2015-03-04 MED ORDER — PREDNISONE 20 MG PO TABS
ORAL_TABLET | ORAL | Status: DC
Start: 2015-03-04 — End: 2015-04-17

## 2015-03-04 NOTE — Progress Notes (Signed)
Pre visit review using our clinic review tool, if applicable. No additional management support is needed unless otherwise documented below in the visit note. 

## 2015-03-04 NOTE — Progress Notes (Signed)
   Subjective:    Patient ID: Cassidy Reese, female    DOB: November 21, 1955, 59 y.o.   MRN: [id number]  Cough This is a new problem. The current episode started in the past 7 days (5 day). The problem has been gradually worsening. The problem occurs constantly. The cough is productive of sputum. Associated symptoms include chills, a sore throat, shortness of breath, sweats and wheezing. Pertinent negatives include no ear pain, fever, myalgias, nasal congestion, postnasal drip or rash. Associated symptoms comments: Fatigue  ears itch. The symptoms are aggravated by lying down. Risk factors: non smoker. She has tried prescription cough suppressant and a beta-agonist inhaler (tried tussionex at bedtime used left over inhaler from bronchtitis she has in 09/2015) for the symptoms. The treatment provided no relief. Her past medical history is significant for bronchitis. There is no history of asthma, bronchiectasis, COPD, emphysema, environmental allergies or pneumonia.  Sore Throat  Associated symptoms include coughing and shortness of breath. Pertinent negatives include no ear pain.     sick contact : grandson with bornchitits  Review of Systems  Constitutional: Positive for chills. Negative for fever.  HENT: Positive for sore throat. Negative for ear pain and postnasal drip.   Respiratory: Positive for cough, shortness of breath and wheezing.   Musculoskeletal: Negative for myalgias.  Skin: Negative for rash.  Allergic/Immunologic: Negative for environmental allergies.       Objective:   Physical Exam  Constitutional: Vital signs are normal. She appears well-developed and well-nourished. She is cooperative.  Non-toxic appearance. She does not appear ill. No distress.  HENT:  Head: Normocephalic.  Right Ear: Hearing, tympanic membrane, external ear and ear canal normal. Tympanic membrane is not erythematous, not retracted and not bulging.  Left Ear: Hearing, tympanic membrane, external  ear and ear canal normal. Tympanic membrane is not erythematous, not retracted and not bulging.  Nose: Mucosal edema and rhinorrhea present. Right sinus exhibits no maxillary sinus tenderness and no frontal sinus tenderness. Left sinus exhibits no maxillary sinus tenderness and no frontal sinus tenderness.  Mouth/Throat: Uvula is midline, oropharynx is clear and moist and mucous membranes are normal.  Eyes: Conjunctivae, EOM and lids are normal. Pupils are equal, round, and reactive to light. Lids are everted and swept, no foreign bodies found.  Neck: Trachea normal and normal range of motion. Neck supple. Carotid bruit is not present. No thyroid mass and no thyromegaly present.  Cardiovascular: Normal rate, regular rhythm, S1 normal, S2 normal, normal heart sounds, intact distal pulses and normal pulses.  Exam reveals no gallop and no friction rub.   No murmur heard. Pulmonary/Chest: Effort normal. No tachypnea. No respiratory distress. She has no decreased breath sounds. She has wheezes. She has no rhonchi. She has no rales.  Neurological: She is alert.  Skin: Skin is warm, dry and intact. No rash noted.  Psychiatric: Her speech is normal and behavior is normal. Judgment normal. Her mood appears not anxious. Cognition and memory are normal. She does not exhibit a depressed mood.          Assessment & Plan:

## 2015-03-04 NOTE — Assessment & Plan Note (Signed)
Cover with antibiotics given worsening, prolonged course in pt with frequent bacterial bronchitis. Trea with prednisone taper for wheeze.

## 2015-03-04 NOTE — Patient Instructions (Signed)
Complete antibiotics. Start prednisone taper. Use albuterol as needed for wheeze.  Rest, fluids. Return to work on 6/2.  Call if not improving as expected, go tpo ER if severe shortness of breath.

## 2015-04-17 ENCOUNTER — Other Ambulatory Visit: Payer: Self-pay | Admitting: Orthopedic Surgery

## 2015-04-17 ENCOUNTER — Encounter: Payer: Self-pay | Admitting: Primary Care

## 2015-04-17 ENCOUNTER — Ambulatory Visit (INDEPENDENT_AMBULATORY_CARE_PROVIDER_SITE_OTHER): Payer: Federal, State, Local not specified - PPO | Admitting: Primary Care

## 2015-04-17 VITALS — BP 136/86 | HR 95 | Temp 98.2°F | Ht 64.5 in | Wt 235.4 lb

## 2015-04-17 DIAGNOSIS — H6692 Otitis media, unspecified, left ear: Secondary | ICD-10-CM

## 2015-04-17 MED ORDER — AMOXICILLIN 500 MG PO CAPS: 500.0000 mg | ORAL_CAPSULE | Freq: Two times a day (BID) | ORAL | Status: DC
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
## 2015-04-17 NOTE — Progress Notes (Signed)
Pre visit review using our clinic review tool, if applicable. No additional management support is needed unless otherwise documented below in the visit note. 

## 2015-04-17 NOTE — Patient Instructions (Signed)
Start Amoxicillin antibiotic. Take 1 tablet by mouth twice daily for 10 days.  You may take ibuprofen for pain.  Call if no improvement in pain in the next 4-5 days. It was nice to meet you!

## 2015-04-21 ENCOUNTER — Telehealth: Payer: Self-pay

## 2015-04-21 NOTE — Telephone Encounter (Signed)
If no improvement -I would rather just ref to ENT -please ask if agreeable with that

## 2015-04-21 NOTE — Telephone Encounter (Signed)
Pt left v/m; pt seen on 04/17/15 with earache; pt taking abx; pt cannot hear out of left ear and wants to know if needs to be rechecked. Pt request cb.

## 2015-04-21 NOTE — Telephone Encounter (Signed)
Patient states that she already has an ENT in her town, Dr. Elenore RotaJuengel. Patient states that she will just give him a call.

## 2015-04-21 NOTE — Telephone Encounter (Signed)
Is the pain gone or still there?  Is the hearing muffled or gone? Thanks

## 2015-04-21 NOTE — Telephone Encounter (Signed)
Called and spoken to patient. Patient stated it feels the same as it was on 04/17/15. Very little pain and the hearing is still muffled.

## 2015-05-22 ENCOUNTER — Encounter (HOSPITAL_BASED_OUTPATIENT_CLINIC_OR_DEPARTMENT_OTHER): Payer: Self-pay | Admitting: *Deleted

## 2015-05-26 ENCOUNTER — Ambulatory Visit (HOSPITAL_BASED_OUTPATIENT_CLINIC_OR_DEPARTMENT_OTHER): Payer: Federal, State, Local not specified - PPO | Admitting: Anesthesiology

## 2015-05-26 ENCOUNTER — Encounter (HOSPITAL_BASED_OUTPATIENT_CLINIC_OR_DEPARTMENT_OTHER): Payer: Self-pay | Admitting: Anesthesiology

## 2015-05-26 ENCOUNTER — Ambulatory Visit (HOSPITAL_BASED_OUTPATIENT_CLINIC_OR_DEPARTMENT_OTHER)
Admission: RE | Admit: 2015-05-26 | Discharge: 2015-05-26 | Disposition: A | Discharge status: Home / Self Care | Payer: Federal, State, Local not specified - PPO | Source: Ambulatory Visit | Attending: Orthopedic Surgery | Admitting: Orthopedic Surgery

## 2015-05-26 ENCOUNTER — Encounter (HOSPITAL_BASED_OUTPATIENT_CLINIC_OR_DEPARTMENT_OTHER)
Admission: RE | Disposition: A | Discharge status: Home / Self Care | Payer: Self-pay | Source: Ambulatory Visit | Attending: Orthopedic Surgery

## 2015-05-26 DIAGNOSIS — K589 Irritable bowel syndrome without diarrhea: Secondary | ICD-10-CM | POA: Diagnosis not present

## 2015-05-26 DIAGNOSIS — K219 Gastro-esophageal reflux disease without esophagitis: Secondary | ICD-10-CM | POA: Diagnosis not present

## 2015-05-26 DIAGNOSIS — G43909 Migraine, unspecified, not intractable, without status migrainosus: Secondary | ICD-10-CM | POA: Insufficient documentation

## 2015-05-26 DIAGNOSIS — Z886 Allergy status to analgesic agent status: Secondary | ICD-10-CM | POA: Diagnosis not present

## 2015-05-26 DIAGNOSIS — M654 Radial styloid tenosynovitis [de Quervain]: Secondary | ICD-10-CM | POA: Insufficient documentation

## 2015-05-26 DIAGNOSIS — Z79899 Other long term (current) drug therapy: Secondary | ICD-10-CM | POA: Diagnosis not present

## 2015-05-26 HISTORY — PX: DORSAL COMPARTMENT RELEASE: SHX5039

## 2015-05-26 LAB — POCT HEMOGLOBIN-HEMACUE: Hemoglobin: 14.5 g/dL (ref 12.0–15.0)

## 2015-05-26 SURGERY — RELEASE, FIRST DORSAL COMPARTMENT, HAND
Anesthesia: Regional | Site: Wrist | Laterality: Right

## 2015-05-26 MED ORDER — MIDAZOLAM HCL 5 MG/5ML IJ SOLN
INTRAMUSCULAR | Status: DC | PRN
  Administered 2015-05-26: 2 mg via INTRAVENOUS

## 2015-05-26 MED ORDER — LIDOCAINE HCL (CARDIAC) 20 MG/ML IV SOLN
INTRAVENOUS | Status: DC | PRN
  Administered 2015-05-26: 50 mg via INTRAVENOUS

## 2015-05-26 MED ORDER — HYDROMORPHONE HCL 2 MG PO TABS
2.0000 mg | ORAL_TABLET | Freq: Once | ORAL | Status: AC
  Administered 2015-05-26: 2 mg via ORAL

## 2015-05-26 MED ORDER — CEFAZOLIN SODIUM-DEXTROSE 2-3 GM-% IV SOLR
2.0000 g | INTRAVENOUS | Status: AC
  Administered 2015-05-26: 2 g via INTRAVENOUS

## 2015-05-26 MED ORDER — BUPIVACAINE HCL (PF) 0.25 % IJ SOLN
INTRAMUSCULAR | Status: DC | PRN
  Administered 2015-05-26: 10 mL

## 2015-05-26 MED ORDER — FENTANYL CITRATE (PF) 100 MCG/2ML IJ SOLN
INTRAMUSCULAR | Status: DC | PRN
  Administered 2015-05-26: 100 ug via INTRAVENOUS

## 2015-05-26 MED ORDER — FENTANYL CITRATE (PF) 100 MCG/2ML IJ SOLN
INTRAMUSCULAR | Status: AC
  Filled 2015-05-26: qty 6

## 2015-05-26 MED ORDER — PROMETHAZINE HCL 25 MG/ML IJ SOLN
6.2500 mg | INTRAMUSCULAR | Status: DC | PRN
  Administered 2015-05-26: 12.5 mg via INTRAVENOUS

## 2015-05-26 MED ORDER — DEXAMETHASONE SODIUM PHOSPHATE 4 MG/ML IJ SOLN
INTRAMUSCULAR | Status: DC | PRN
  Administered 2015-05-26: 10 mg via INTRAVENOUS

## 2015-05-26 MED ORDER — HYDROMORPHONE HCL 2 MG PO TABS: 2.0000 mg | ORAL_TABLET | Freq: Four times a day (QID) | ORAL | Status: DC | PRN

## 2015-05-26 MED ORDER — GLYCOPYRROLATE 0.2 MG/ML IJ SOLN: 0.2000 mg | Freq: Once | INTRAMUSCULAR | Status: DC | PRN

## 2015-05-26 MED ORDER — CHLORHEXIDINE GLUCONATE 4 % EX LIQD: 60.0000 mL | Freq: Once | CUTANEOUS | Status: DC

## 2015-05-26 MED ORDER — BUPIVACAINE HCL (PF) 0.25 % IJ SOLN
INTRAMUSCULAR | Status: AC
  Filled 2015-05-26: qty 30

## 2015-05-26 MED ORDER — PROMETHAZINE HCL 25 MG/ML IJ SOLN
INTRAMUSCULAR | Status: AC
  Filled 2015-05-26: qty 1

## 2015-05-26 MED ORDER — METOPROLOL TARTRATE 1 MG/ML IV SOLN
INTRAVENOUS | Status: DC | PRN
  Administered 2015-05-26: 2.5 mg via INTRAVENOUS

## 2015-05-26 MED ORDER — LACTATED RINGERS IV SOLN
INTRAVENOUS | Status: DC
  Administered 2015-05-26 (×2): via INTRAVENOUS

## 2015-05-26 MED ORDER — ONDANSETRON HCL 4 MG/2ML IJ SOLN
INTRAMUSCULAR | Status: DC | PRN
  Administered 2015-05-26: 4 mg via INTRAVENOUS

## 2015-05-26 MED ORDER — PROPOFOL 10 MG/ML IV BOLUS
INTRAVENOUS | Status: DC | PRN
  Administered 2015-05-26: 200 mg via INTRAVENOUS

## 2015-05-26 MED ORDER — FENTANYL CITRATE (PF) 100 MCG/2ML IJ SOLN
INTRAMUSCULAR | Status: AC
  Filled 2015-05-26: qty 2

## 2015-05-26 MED ORDER — HYDROMORPHONE HCL 2 MG PO TABS
ORAL_TABLET | ORAL | Status: AC
  Filled 2015-05-26: qty 1

## 2015-05-26 MED ORDER — FENTANYL CITRATE (PF) 100 MCG/2ML IJ SOLN
25.0000 ug | INTRAMUSCULAR | Status: DC | PRN
  Administered 2015-05-26 (×2): 50 ug via INTRAVENOUS

## 2015-05-26 MED ORDER — MIDAZOLAM HCL 2 MG/2ML IJ SOLN
INTRAMUSCULAR | Status: AC
  Filled 2015-05-26: qty 2

## 2015-05-26 MED ORDER — SCOPOLAMINE 1 MG/3DAYS TD PT72: 1.0000 | MEDICATED_PATCH | Freq: Once | TRANSDERMAL | Status: DC | PRN

## 2015-05-26 MED ORDER — SODIUM CHLORIDE 0.9 % IR SOLN
Status: DC | PRN
  Administered 2015-05-26: 100 mL

## 2015-05-26 SURGICAL SUPPLY — 39 items
BANDAGE ELASTIC 3 VELCRO ST LF (GAUZE/BANDAGES/DRESSINGS) ×2 IMPLANT
BLADE MINI RND TIP GREEN BEAV (BLADE) IMPLANT
BLADE SURG 15 STRL LF DISP TIS (BLADE) ×2 IMPLANT
BLADE SURG 15 STRL SS (BLADE) ×6
BNDG CMPR 9X4 STRL LF SNTH (GAUZE/BANDAGES/DRESSINGS) ×1
BNDG ESMARK 4X9 LF (GAUZE/BANDAGES/DRESSINGS) ×2 IMPLANT
BNDG GAUZE ELAST 4 BULKY (GAUZE/BANDAGES/DRESSINGS) ×3 IMPLANT
CHLORAPREP W/TINT 26ML (MISCELLANEOUS) ×3 IMPLANT
CORDS BIPOLAR (ELECTRODE) ×3 IMPLANT
COVER BACK TABLE 60X90IN (DRAPES) ×3 IMPLANT
COVER MAYO STAND STRL (DRAPES) ×3 IMPLANT
CUFF TOURNIQUET SINGLE 18IN (TOURNIQUET CUFF) ×3 IMPLANT
DRAPE EXTREMITY T 121X128X90 (DRAPE) ×3 IMPLANT
DRAPE SURG 17X23 STRL (DRAPES) ×3 IMPLANT
DRSG PAD ABDOMINAL 8X10 ST (GAUZE/BANDAGES/DRESSINGS) ×3 IMPLANT
GAUZE SPONGE 4X4 12PLY STRL (GAUZE/BANDAGES/DRESSINGS) ×3 IMPLANT
GAUZE XEROFORM 1X8 LF (GAUZE/BANDAGES/DRESSINGS) ×3 IMPLANT
GLOVE BIO SURGEON STRL SZ7.5 (GLOVE) ×3 IMPLANT
GLOVE BIOGEL PI IND STRL 7.0 (GLOVE) IMPLANT
GLOVE BIOGEL PI IND STRL 8 (GLOVE) ×1 IMPLANT
GLOVE BIOGEL PI INDICATOR 7.0 (GLOVE) ×2
GLOVE BIOGEL PI INDICATOR 8 (GLOVE) ×2
GLOVE ECLIPSE 6.5 STRL STRAW (GLOVE) ×2 IMPLANT
GLOVE EXAM NITRILE MD LF STRL (GLOVE) ×2 IMPLANT
GOWN STRL REUS W/ TWL LRG LVL3 (GOWN DISPOSABLE) ×1 IMPLANT
GOWN STRL REUS W/TWL LRG LVL3 (GOWN DISPOSABLE) ×3
GOWN STRL REUS W/TWL XL LVL3 (GOWN DISPOSABLE) ×3 IMPLANT
NDL HYPO 25X1 1.5 SAFETY (NEEDLE) ×1 IMPLANT
NEEDLE HYPO 25X1 1.5 SAFETY (NEEDLE) ×3 IMPLANT
NS IRRIG 1000ML POUR BTL (IV SOLUTION) ×3 IMPLANT
PACK BASIN DAY SURGERY FS (CUSTOM PROCEDURE TRAY) ×3 IMPLANT
PADDING CAST ABS 4INX4YD NS (CAST SUPPLIES) ×2
PADDING CAST ABS COTTON 4X4 ST (CAST SUPPLIES) ×1 IMPLANT
STOCKINETTE 4X48 STRL (DRAPES) ×3 IMPLANT
SUT ETHILON 4 0 PS 2 18 (SUTURE) ×3 IMPLANT
SYR BULB 3OZ (MISCELLANEOUS) ×3 IMPLANT
SYR CONTROL 10ML LL (SYRINGE) ×3 IMPLANT
TOWEL OR 17X24 6PK STRL BLUE (TOWEL DISPOSABLE) ×5 IMPLANT
UNDERPAD 30X30 (UNDERPADS AND DIAPERS) ×3 IMPLANT

## 2015-05-26 NOTE — Anesthesia Procedure Notes (Signed)
Procedure Name: LMA Insertion Date/Time: 05/26/2015 1:15 PM Performed by: Genevieve Norlander L Pre-anesthesia Checklist: Patient identified, Emergency Drugs available, Suction available, Patient being monitored and Timeout performed Patient Re-evaluated:Patient Re-evaluated prior to inductionOxygen Delivery Method: Circle System Utilized Preoxygenation: Pre-oxygenation with 100% oxygen Intubation Type: IV induction Ventilation: Mask ventilation without difficulty LMA: LMA inserted LMA Size: 4.0 Number of attempts: 1 Airway Equipment and Method: Bite block Placement Confirmation: positive ETCO2 Tube secured with: Tape Dental Injury: Teeth and Oropharynx as per pre-operative assessment

## 2015-05-26 NOTE — Brief Op Note (Signed)
05/26/2015  1:36 PM  PATIENT:  Marlin Canary Pfefferle  59 y.o. female  PRE-OPERATIVE DIAGNOSIS:  CHRONIC DEQUERVAIN'S RIGHT WRIST  POST-OPERATIVE DIAGNOSIS:  CHRONIC DEQUERVAIN'S RIGHT WRIST  PROCEDURE:  Procedure(s): RELEASE RIGHT  DORSAL COMPARTMENT (DEQUERVAIN) (Right)  SURGEON:  Surgeon(s) and Role:    * Betha Loa, MD - Primary  PHYSICIAN ASSISTANT:   ASSISTANTS: none   ANESTHESIA:   general  EBL:     BLOOD ADMINISTERED:none  DRAINS: none   LOCAL MEDICATIONS USED:  MARCAINE     SPECIMEN:  No Specimen  DISPOSITION OF SPECIMEN:  N/A  COUNTS:  YES  TOURNIQUET:   Total Tourniquet Time Documented: Upper Arm (Right) - 12 minutes Total: Upper Arm (Right) - 12 minutes   DICTATION: .Other Dictation: Dictation Number [id number]  PLAN OF CARE: Discharge to home after PACU  PATIENT DISPOSITION:  PACU - hemodynamically stable.

## 2015-05-26 NOTE — Anesthesia Preprocedure Evaluation (Addendum)
Anesthesia Evaluation  Patient identified by MRN, date of birth, ID band Patient awake    Reviewed: Allergy & Precautions, NPO status , Patient's Chart, lab work & pertinent test results  Airway Mallampati: I  TM Distance: >3 FB Neck ROM: Full    Dental  (+) Teeth Intact, Dental Advisory Given   Pulmonary  breath sounds clear to auscultation        Cardiovascular Rhythm:Regular Rate:Normal     Neuro/Psych    GI/Hepatic GERD-  Medicated and Controlled,  Endo/Other    Renal/GU      Musculoskeletal   Abdominal   Peds  Hematology   Anesthesia Other Findings   Reproductive/Obstetrics                             Anesthesia Physical Anesthesia Plan  ASA: I  Anesthesia Plan: General and Regional   Post-op Pain Management:    Induction: Intravenous  Airway Management Planned: LMA  Additional Equipment:   Intra-op Plan:   Post-operative Plan: Extubation in OR  Informed Consent: I have reviewed the patients History and Physical, chart, labs and discussed the procedure including the risks, benefits and alternatives for the proposed anesthesia with the patient or authorized representative who has indicated his/her understanding and acceptance.   Dental advisory given  Plan Discussed with: CRNA, Anesthesiologist and Surgeon  Anesthesia Plan Comments:        Anesthesia Quick Evaluation

## 2015-05-26 NOTE — Op Note (Signed)
[id number] 
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
## 2015-05-26 NOTE — Discharge Instructions (Addendum)

## 2015-05-26 NOTE — Transfer of Care (Signed)
Immediate Anesthesia Transfer of Care Note  Patient: Cassidy Reese  Procedure(s) Performed: Procedure(s): RELEASE RIGHT  DORSAL COMPARTMENT (DEQUERVAIN) (Right)  Patient Location: PACU  Anesthesia Type:General  Level of Consciousness: awake and patient cooperative  Airway & Oxygen Therapy: Patient Spontanous Breathing and Patient connected to face mask oxygen  Post-op Assessment: Report given to RN and Post -op Vital signs reviewed and stable  Post vital signs: Reviewed and stable  Last Vitals:  Filed Vitals:   05/26/15 1340  BP:   Pulse: 105  Temp:   Resp:     Complications: No apparent anesthesia complications

## 2015-05-27 NOTE — Anesthesia Postprocedure Evaluation (Signed)
  Anesthesia Post-op Note  Patient: Cassidy Reese  Procedure(s) Performed: Procedure(s): RELEASE RIGHT  DORSAL COMPARTMENT (DEQUERVAIN) (Right)  Patient Location: PACU  Anesthesia Type: General, Regional   Level of Consciousness: awake, alert  and oriented  Airway and Oxygen Therapy: Patient Spontanous Breathing  Post-op Pain: mild  Post-op Assessment: Post-op Vital signs reviewed  Post-op Vital Signs: Reviewed  Last Vitals:  Filed Vitals:   05/26/15 1503  BP: 159/78  Pulse: 66  Temp: 36.7 C  Resp: 18    Complications: No apparent anesthesia complications

## 2015-05-27 NOTE — Op Note (Signed)
NAMEUZMA, HELLMER             ACCOUNT NO.:  [id number]  MEDICAL RECORD NO.:  [id number]  LOCATION:                                 FACILITY:  PHYSICIAN:  Betha Loa, MD             DATE OF BIRTH:  DATE OF PROCEDURE:  05/26/2015 DATE OF DISCHARGE:                              OPERATIVE REPORT   PREOPERATIVE DIAGNOSIS:  Right de Quervain's tenosynovitis.  POSTOPERATIVE DIAGNOSIS:  Right de Quervain's tenosynovitis.  PROCEDURE:  Right first dorsal compartment release.  SURGEON:  Betha Loa, MD  ASSISTANT:  None.  ANESTHESIA:  General.  IV FLUIDS:  Per anesthesia flow sheet.  ESTIMATED BLOOD LOSS:  Minimal.  COMPLICATIONS:  None.  SPECIMENS:  None.  TOURNIQUET TIME:  12 minutes.  DISPOSITION:  Stable to PACU.  INDICATIONS:  Ms. Kushner is a 59 year old female who has had painful right wrist for approximately 6 months.  This has been bothersome to her.  She has had injection twice without relief.  She wishes to have it released.  Risks, benefits, and alternatives of surgery were discussed including risk of blood loss; infection; damage to nerves, vessels, tendons, ligaments, bone; failure of surgery; need for additional surgery; complications with wound healing, continued pain, and recurrence of pain.  She voiced understanding of these risks and elected to proceed.  OPERATIVE COURSE:  After being identified preoperatively by myself, the patient and I agreed upon procedure and site of procedure.  Surgical site was marked.  The risks, benefits, and alternatives of surgery were reviewed, and she wished to proceed.  Surgical consent had been signed. She was given IV Ancef as preoperative antibiotic prophylaxis.  She was transferred to the operating room, placed on the operating room table in a supine position with the right upper extremity on arm board.  General anesthesia was induced by Anesthesiology.  Right upper extremity was prepped and draped in normal  sterile orthopedic fashion.  Surgical pause was performed between surgeons, anesthesia, and operating staff, and all were in agreement as to the patient, procedure, and site of procedure. Tourniquet at the proximal aspect of the extremity was inflated to 250 mmHg after exsanguination of the limb with an Esmarch bandage.  A Brunner-type incision was made at the radial side of the wrist and carried into subcutaneous tissues by spreading technique.  Bipolar electrocautery was used to obtain hemostasis.  The branches of the superficial branch radial nerve were identified and protected throughout the case.  The first dorsal compartment was identified.  It was incised sharply.  There were 2 separate compartments for the APL and EPB with a thick septum between them.  Both compartments were released.  The septum was removed.  There was some inflammation of the tendon lining.  The wound was copiously irrigated with sterile saline, closed with 4-0 nylon horizontal mattress fashion.  It was injected with 10 mL of 0.25% plain Marcaine to aid in postoperative analgesia.  It was then dressed with sterile Xeroform, 4x4s, and ABD and wrapped with a Kerlix and Ace bandage.  Tourniquet was deflated at 12 minutes.  Fingertips were pink with brisk capillary refill after deflation of the tourniquet.  Operative drapes were broken down.  The patient was awoken from anesthesia safely.  She was transferred back to the stretcher and taken to the PACU in stable condition.  I will see her back in the office in 1 week for postoperative followup.  I will give her Dilaudid 2 mg p.o. q.6 hours p.r.n. pain, dispensed #20.     Betha Loa, MD     KK/MEDQ  D:  05/26/2015  T:  05/27/2015  Job:  [id number]

## 2015-05-28 ENCOUNTER — Encounter (HOSPITAL_BASED_OUTPATIENT_CLINIC_OR_DEPARTMENT_OTHER): Payer: Self-pay | Admitting: Orthopedic Surgery

## 2015-05-29 ENCOUNTER — Other Ambulatory Visit: Payer: Self-pay

## 2015-05-29 DIAGNOSIS — Z1231 Encounter for screening mammogram for malignant neoplasm of breast: Secondary | ICD-10-CM

## 2015-05-30 ENCOUNTER — Ambulatory Visit
Admission: RE | Admit: 2015-05-30 | Discharge: 2015-05-30 | Disposition: A | Discharge status: Home / Self Care | Payer: Federal, State, Local not specified - PPO | Source: Ambulatory Visit

## 2015-05-30 ENCOUNTER — Other Ambulatory Visit: Payer: Self-pay | Admitting: *Deleted

## 2015-05-30 DIAGNOSIS — Z1231 Encounter for screening mammogram for malignant neoplasm of breast: Secondary | ICD-10-CM

## 2015-05-30 MED ORDER — OMEPRAZOLE-SODIUM BICARBONATE 40-1100 MG PO CAPS: ORAL_CAPSULE | ORAL | Status: DC

## 2015-05-30 NOTE — Telephone Encounter (Signed)
Please schedule PE in winter and refill until then

## 2015-05-30 NOTE — Telephone Encounter (Signed)
appt scheduled and med refilled 

## 2015-05-30 NOTE — Telephone Encounter (Signed)
Fax refill request, pt has had a few acute appt., but no recent CPE or f/u appt with Dr. Milinda Antis and no future appt., please advise

## 2015-06-02 ENCOUNTER — Other Ambulatory Visit: Payer: Self-pay | Admitting: Family Medicine

## 2015-06-02 DIAGNOSIS — R928 Other abnormal and inconclusive findings on diagnostic imaging of breast: Secondary | ICD-10-CM

## 2015-06-10 ENCOUNTER — Ambulatory Visit
Admission: RE | Admit: 2015-06-10 | Discharge: 2015-06-10 | Disposition: A | Discharge status: Home / Self Care | Payer: Federal, State, Local not specified - PPO | Source: Ambulatory Visit | Attending: Family Medicine | Admitting: Family Medicine

## 2015-06-10 DIAGNOSIS — R928 Other abnormal and inconclusive findings on diagnostic imaging of breast: Secondary | ICD-10-CM

## 2015-07-31 ENCOUNTER — Ambulatory Visit (INDEPENDENT_AMBULATORY_CARE_PROVIDER_SITE_OTHER): Payer: Federal, State, Local not specified - PPO

## 2015-07-31 ENCOUNTER — Encounter: Payer: Self-pay | Admitting: Podiatry

## 2015-07-31 ENCOUNTER — Ambulatory Visit (INDEPENDENT_AMBULATORY_CARE_PROVIDER_SITE_OTHER): Payer: Federal, State, Local not specified - PPO | Admitting: Podiatry

## 2015-07-31 DIAGNOSIS — M792 Neuralgia and neuritis, unspecified: Secondary | ICD-10-CM | POA: Diagnosis not present

## 2015-07-31 DIAGNOSIS — R52 Pain, unspecified: Secondary | ICD-10-CM

## 2015-07-31 DIAGNOSIS — M2142 Flat foot [pes planus] (acquired), left foot: Secondary | ICD-10-CM | POA: Diagnosis not present

## 2015-07-31 DIAGNOSIS — M2141 Flat foot [pes planus] (acquired), right foot: Secondary | ICD-10-CM

## 2015-07-31 DIAGNOSIS — M25571 Pain in right ankle and joints of right foot: Secondary | ICD-10-CM

## 2015-07-31 MED ORDER — METHYLPREDNISOLONE 4 MG PO TBPK: ORAL_TABLET | ORAL | Status: DC
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
## 2015-08-07 ENCOUNTER — Telehealth: Payer: Self-pay | Admitting: Family Medicine

## 2015-08-07 DIAGNOSIS — E782 Mixed hyperlipidemia: Secondary | ICD-10-CM

## 2015-08-07 DIAGNOSIS — Z0000 Encounter for general adult medical examination without abnormal findings: Secondary | ICD-10-CM

## 2015-08-07 DIAGNOSIS — R7309 Other abnormal glucose: Secondary | ICD-10-CM

## 2015-08-07 NOTE — Telephone Encounter (Signed)
-----   Message from Terri J Walsh sent at 08/05/2015  8:37 AM EDT ----- Regarding: Lab orders for Friday, 11.4.16 Patient is scheduled for CPX labs, please order future labs, Thanks , Terri  

## 2015-08-08 ENCOUNTER — Other Ambulatory Visit (INDEPENDENT_AMBULATORY_CARE_PROVIDER_SITE_OTHER): Payer: Federal, State, Local not specified - PPO

## 2015-08-08 DIAGNOSIS — Z0000 Encounter for general adult medical examination without abnormal findings: Secondary | ICD-10-CM

## 2015-08-08 DIAGNOSIS — R7309 Other abnormal glucose: Secondary | ICD-10-CM

## 2015-08-08 DIAGNOSIS — E782 Mixed hyperlipidemia: Secondary | ICD-10-CM | POA: Diagnosis not present

## 2015-08-08 LAB — LIPID PANEL
CHOLESTEROL: 181 mg/dL (ref 0–200)
HDL: 45.7 mg/dL (ref >39.00)
LDL CALC: 113 mg/dL — AB (ref 0–99)
NonHDL: 135.76
TRIGLYCERIDES: 114 mg/dL (ref 0.0–149.0)
Total CHOL/HDL Ratio: 4
VLDL: 22.8 mg/dL (ref 0.0–40.0)

## 2015-08-08 LAB — CBC WITH DIFFERENTIAL/PLATELET
BASOS ABS: 0 10*3/uL (ref 0.0–0.1)
Basophils Relative: 0.5 % (ref 0.0–3.0)
EOS ABS: 0.4 10*3/uL (ref 0.0–0.7)
Eosinophils Relative: 5.2 % — ABNORMAL HIGH (ref 0.0–5.0)
HCT: 42.6 % (ref 36.0–46.0)
HEMOGLOBIN: 14.5 g/dL (ref 12.0–15.0)
LYMPHS ABS: 1.8 10*3/uL (ref 0.7–4.0)
Lymphocytes Relative: 24 % (ref 12.0–46.0)
MCHC: 34.1 g/dL (ref 30.0–36.0)
MCV: 91.8 fl (ref 78.0–100.0)
MONO ABS: 0.5 10*3/uL (ref 0.1–1.0)
Monocytes Relative: 6.5 % (ref 3.0–12.0)
NEUTROS PCT: 63.8 % (ref 43.0–77.0)
Neutro Abs: 4.8 10*3/uL (ref 1.4–7.7)
Platelets: 257 10*3/uL (ref 150.0–400.0)
RBC: 4.64 Mil/uL (ref 3.87–5.11)
RDW: 13.2 % (ref 11.5–15.5)
WBC: 7.6 10*3/uL (ref 4.0–10.5)

## 2015-08-08 LAB — COMPREHENSIVE METABOLIC PANEL
ALBUMIN: 3.9 g/dL (ref 3.5–5.2)
ALK PHOS: 72 U/L (ref 39–117)
ALT: 23 U/L (ref 0–35)
AST: 16 U/L (ref 0–37)
BUN: 22 mg/dL (ref 6–23)
CO2: 31 mEq/L (ref 19–32)
CREATININE: 0.86 mg/dL (ref 0.40–1.20)
Calcium: 9.3 mg/dL (ref 8.4–10.5)
Chloride: 105 mEq/L (ref 96–112)
GFR: 72.24 mL/min (ref >60.00)
GLUCOSE: 160 mg/dL — AB (ref 70–99)
POTASSIUM: 4.5 meq/L (ref 3.5–5.1)
SODIUM: 141 meq/L (ref 135–145)
TOTAL PROTEIN: 6.2 g/dL (ref 6.0–8.3)
Total Bilirubin: 0.6 mg/dL (ref 0.2–1.2)

## 2015-08-08 LAB — TSH: TSH: 1.76 u[IU]/mL (ref 0.35–4.50)

## 2015-08-08 LAB — HEMOGLOBIN A1C: HEMOGLOBIN A1C: 6.1 % (ref 4.6–6.5)

## 2015-08-12 ENCOUNTER — Ambulatory Visit (INDEPENDENT_AMBULATORY_CARE_PROVIDER_SITE_OTHER): Payer: Federal, State, Local not specified - PPO | Admitting: Family Medicine

## 2015-08-12 ENCOUNTER — Encounter: Payer: Self-pay | Admitting: Family Medicine

## 2015-08-12 VITALS — BP 140/80 | HR 84 | Temp 98.5°F | Ht 64.5 in | Wt 235.0 lb

## 2015-08-12 DIAGNOSIS — E782 Mixed hyperlipidemia: Secondary | ICD-10-CM

## 2015-08-12 DIAGNOSIS — R7309 Other abnormal glucose: Secondary | ICD-10-CM

## 2015-08-12 DIAGNOSIS — Z0000 Encounter for general adult medical examination without abnormal findings: Secondary | ICD-10-CM

## 2015-08-12 DIAGNOSIS — Z23 Encounter for immunization: Secondary | ICD-10-CM

## 2015-08-12 DIAGNOSIS — R030 Elevated blood-pressure reading, without diagnosis of hypertension: Secondary | ICD-10-CM

## 2015-08-12 DIAGNOSIS — IMO0001 Reserved for inherently not codable concepts without codable children: Secondary | ICD-10-CM | POA: Insufficient documentation

## 2015-08-12 MED ORDER — OMEPRAZOLE-SODIUM BICARBONATE 40-1100 MG PO CAPS: ORAL_CAPSULE | ORAL | Status: DC
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
## 2015-08-12 NOTE — Patient Instructions (Signed)
Flu shot today  BP is borderline - follow up in 6 months  Eat low fat and low sugar diet Ramp up exercise when you can

## 2015-08-12 NOTE — Progress Notes (Signed)
Pre visit review using our clinic review tool, if applicable. No additional management support is needed unless otherwise documented below in the visit note. 

## 2015-08-14 NOTE — Assessment & Plan Note (Signed)
Lab Results  Component Value Date   HGBA1C 6.1 08/08/2015   This is up a bit Disc wt loss and low glycemic diet to improve it and prevent DM

## 2015-08-14 NOTE — Assessment & Plan Note (Signed)
Reviewed health habits including diet and exercise and skin cancer prevention Reviewed appropriate screening tests for age  Also reviewed health mt list, fam hx and immunization status , as well as social and family history   See HPI Labs reviewed Flu shot today  BP is borderline - follow up in 6 months  Eat low fat and low sugar diet Ramp up exercise when you can

## 2015-08-14 NOTE — Assessment & Plan Note (Signed)
Disc goals for lipids and reasons to control them Rev labs with pt Rev low sat fat diet in detail Goal to inc HDL - exercise can help when she can start back

## 2015-08-21 ENCOUNTER — Ambulatory Visit: Payer: Federal, State, Local not specified - PPO | Admitting: Podiatry

## 2015-09-04 ENCOUNTER — Ambulatory Visit: Payer: Federal, State, Local not specified - PPO | Admitting: *Deleted

## 2015-09-04 ENCOUNTER — Encounter: Payer: Self-pay | Admitting: Podiatry

## 2015-09-04 ENCOUNTER — Ambulatory Visit: Payer: Federal, State, Local not specified - PPO | Admitting: Podiatry

## 2015-09-04 DIAGNOSIS — M2142 Flat foot [pes planus] (acquired), left foot: Principal | ICD-10-CM

## 2015-09-04 DIAGNOSIS — M2141 Flat foot [pes planus] (acquired), right foot: Secondary | ICD-10-CM

## 2015-09-04 NOTE — Patient Instructions (Signed)

## 2015-09-04 NOTE — Progress Notes (Signed)
Patient ID: Cassidy CoderBeatrice Wenkel Reese, female   DOB: 08-31-1956, 59 y.o.   MRN: [id number] Patient presents for orthotic pick up.  Orthotics are too short sending back to lengthen with a rush status.

## 2015-09-16 ENCOUNTER — Telehealth: Payer: Self-pay | Admitting: *Deleted

## 2015-09-16 NOTE — Telephone Encounter (Signed)
i called patient and left a message stating that her inserts were in and to come by the [place] office to pick up and we are open till 5 pm and closed for lunch 12:15 to 1:15 pm. [name]

## 2015-12-18 ENCOUNTER — Other Ambulatory Visit: Payer: Federal, State, Local not specified - PPO

## 2015-12-18 ENCOUNTER — Telehealth: Payer: Self-pay | Admitting: *Deleted

## 2015-12-18 MED ORDER — VALACYCLOVIR HCL 1 G PO TABS: ORAL_TABLET | ORAL | Status: DC

## 2015-12-18 NOTE — Telephone Encounter (Signed)
I sent Valtrex to [facility]

## 2015-12-18 NOTE — Telephone Encounter (Signed)
Pt left voicemail at Triage. Pt hasn't had a fever blister in the last 10 years but with in the last 3 weeks she has had 2 fever blister, pt has been putting abreva on them and it's not helping. Pt is requesting Rx to help clear them up pt is [facility]

## 2015-12-18 NOTE — Telephone Encounter (Signed)
Pt notified Rx sent to pharmacy

## 2015-12-19 ENCOUNTER — Other Ambulatory Visit: Payer: Self-pay | Admitting: Family Medicine

## 2015-12-19 DIAGNOSIS — R921 Mammographic calcification found on diagnostic imaging of breast: Secondary | ICD-10-CM

## 2015-12-30 ENCOUNTER — Ambulatory Visit
Admission: RE | Admit: 2015-12-30 | Discharge: 2015-12-30 | Disposition: A | Discharge status: Home / Self Care | Payer: Federal, State, Local not specified - PPO | Source: Ambulatory Visit | Attending: Family Medicine | Admitting: Family Medicine

## 2015-12-30 DIAGNOSIS — R921 Mammographic calcification found on diagnostic imaging of breast: Secondary | ICD-10-CM

## 2016-01-01 ENCOUNTER — Ambulatory Visit (INDEPENDENT_AMBULATORY_CARE_PROVIDER_SITE_OTHER): Payer: Federal, State, Local not specified - PPO | Admitting: Podiatry

## 2016-01-01 ENCOUNTER — Encounter: Payer: Self-pay | Admitting: Podiatry

## 2016-01-01 VITALS — BP 136/86 | HR 85 | Resp 16

## 2016-01-01 DIAGNOSIS — M792 Neuralgia and neuritis, unspecified: Secondary | ICD-10-CM

## 2016-01-01 DIAGNOSIS — M779 Enthesopathy, unspecified: Secondary | ICD-10-CM

## 2016-01-01 MED ORDER — TRIAMCINOLONE ACETONIDE 10 MG/ML IJ SUSP
10.0000 mg | Freq: Once | INTRAMUSCULAR | Status: AC
  Administered 2016-01-01: 10 mg

## 2016-01-01 MED ORDER — GABAPENTIN 300 MG PO CAPS: 300.0000 mg | ORAL_CAPSULE | Freq: Three times a day (TID) | ORAL | Status: DC

## 2016-01-02 NOTE — Progress Notes (Signed)
Subjective:     Patient ID: Cassidy Reese, female   DOB: [birth year], 60 y.o.   MRN: [id number]  HPI patient presents stating I'm getting a lot of pain in the outside of my right ankle and I do not have my orthotics back yet. States that it feels like it's inflamed   Review of Systems     Objective:   Physical Exam Neurovascular status intact with inflammation around the right lateral ankle in the peroneal group with no current indication of dysfunction of the tendon but appears to be inflamed and has tendinitis    Assessment:     Probable tendinitis of the lateral group with possibility for bony injury    Plan:     Went ahead and reviewed x-rays and they did careful sheath injection right lateral ankle group and peroneal group and applied fascial brace to stabilize the arch. Patient will be seen back and if symptoms persist may require MRI to rule out tear of the tendon  X-ray report indicates that there is no indications of diastases injury mild arthritis of the fibula and no other pathology

## 2016-01-07 ENCOUNTER — Telehealth: Payer: Self-pay | Admitting: Family Medicine

## 2016-01-07 NOTE — Telephone Encounter (Signed)
PA for pt's Zegerid started at [email or web address], will await response

## 2016-01-13 NOTE — Telephone Encounter (Signed)
Pt left v/m requesting status of PA for zegerid.

## 2016-01-14 NOTE — Telephone Encounter (Signed)
Called to check the status of PA and rep advise me it was approved from 11/15/15- 01/13/17, pt notified and insurance will fax us over an approval letter in the next few days

## 2016-02-05 ENCOUNTER — Ambulatory Visit: Payer: Federal, State, Local not specified - PPO | Admitting: *Deleted

## 2016-02-05 ENCOUNTER — Other Ambulatory Visit (INDEPENDENT_AMBULATORY_CARE_PROVIDER_SITE_OTHER): Payer: Federal, State, Local not specified - PPO

## 2016-02-05 DIAGNOSIS — E782 Mixed hyperlipidemia: Secondary | ICD-10-CM

## 2016-02-05 DIAGNOSIS — R7309 Other abnormal glucose: Secondary | ICD-10-CM

## 2016-02-05 DIAGNOSIS — M2142 Flat foot [pes planus] (acquired), left foot: Principal | ICD-10-CM

## 2016-02-05 DIAGNOSIS — M2141 Flat foot [pes planus] (acquired), right foot: Secondary | ICD-10-CM

## 2016-02-05 LAB — LIPID PANEL
Cholesterol: 194 mg/dL (ref 0–200)
HDL: 40.2 mg/dL (ref >39.00)
LDL Cholesterol: 133 mg/dL — ABNORMAL HIGH (ref 0–99)
NonHDL: 153.85
TRIGLYCERIDES: 102 mg/dL (ref 0.0–149.0)
Total CHOL/HDL Ratio: 5
VLDL: 20.4 mg/dL (ref 0.0–40.0)

## 2016-02-05 LAB — HEMOGLOBIN A1C: HEMOGLOBIN A1C: 6.3 % (ref 4.6–6.5)

## 2016-02-05 NOTE — Progress Notes (Signed)
"  I'm picking up my orthotics.  When they made them they were too short.  Then when they lengthened them they lengthened the wrong end."  Patient presents to pick up orthotics.  She stated she had worn orthotics before.  I gave her orthotic wearing instructions.

## 2016-02-05 NOTE — Patient Instructions (Signed)

## 2016-02-10 ENCOUNTER — Encounter: Payer: Self-pay | Admitting: Family Medicine

## 2016-02-10 ENCOUNTER — Ambulatory Visit (INDEPENDENT_AMBULATORY_CARE_PROVIDER_SITE_OTHER): Payer: Federal, State, Local not specified - PPO | Admitting: Family Medicine

## 2016-02-10 VITALS — BP 118/78 | HR 64 | Temp 97.9°F | Ht 64.5 in | Wt 223.5 lb

## 2016-02-10 DIAGNOSIS — E782 Mixed hyperlipidemia: Secondary | ICD-10-CM

## 2016-02-10 DIAGNOSIS — R7309 Other abnormal glucose: Secondary | ICD-10-CM

## 2016-02-10 DIAGNOSIS — E669 Obesity, unspecified: Secondary | ICD-10-CM | POA: Diagnosis not present

## 2016-02-10 NOTE — Assessment & Plan Note (Signed)
Much improved- 3 wk of a diet challenge at work- down 16 lb! Rev calorie intake  Suggested adding exercise when able

## 2016-02-10 NOTE — Progress Notes (Signed)
Pre visit review using our clinic review tool, if applicable. No additional management support is needed unless otherwise documented below in the visit note. 

## 2016-02-10 NOTE — Patient Instructions (Signed)
Keep making the weight loss effort! Add exercise when you can  Drink water  Follow up for PE after nov 8th with labs prior

## 2016-02-10 NOTE — Assessment & Plan Note (Signed)
A1C is up slt  Lab Results  Component Value Date   HGBA1C 6.3 02/05/2016   I suspect with further wt loss (has only been 3 wk so far) that we will see a bigger imp  Stressed imp of low glycemic diet and exercise and further wt loss to prevent DM F/u in Nov
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
## 2016-02-10 NOTE — Assessment & Plan Note (Signed)
LDL is up a bit at 133  Disc lowering red meat intake   Disc goals for lipids and reasons to control them Rev labs with pt Rev low sat fat diet in detail   F/u nov

## 2016-03-09 ENCOUNTER — Encounter: Payer: Self-pay | Admitting: Internal Medicine

## 2016-03-09 ENCOUNTER — Ambulatory Visit (INDEPENDENT_AMBULATORY_CARE_PROVIDER_SITE_OTHER): Payer: Federal, State, Local not specified - PPO | Admitting: Internal Medicine

## 2016-03-09 VITALS — BP 120/76 | HR 82 | Temp 98.2°F | Wt 218.0 lb

## 2016-03-09 DIAGNOSIS — S90511A Abrasion, right ankle, initial encounter: Secondary | ICD-10-CM

## 2016-03-09 NOTE — Progress Notes (Signed)
Pre visit review using our clinic review tool, if applicable. No additional management support is needed unless otherwise documented below in the visit note. 

## 2016-03-09 NOTE — Patient Instructions (Signed)
Abrasion An abrasion is a cut or scrape on the outer surface of your skin. An abrasion does not extend through all of the layers of your skin. It is important to care for your abrasion properly to prevent infection. CAUSES Most abrasions are caused by falling on or gliding across the ground or another surface. When your skin rubs on something, the outer and inner layer of skin rubs off.  SYMPTOMS A cut or scrape is the main symptom of this condition. The scrape may be bleeding, or it may appear red or pink. If there was an associated fall, there may be an underlying bruise. DIAGNOSIS An abrasion is diagnosed with a physical exam. TREATMENT Treatment for this condition depends on how large and deep the abrasion is. Usually, your abrasion will be cleaned with water and mild soap. This removes any dirt or debris that may be stuck. An antibiotic ointment may be applied to the abrasion to help prevent infection. A bandage (dressing) may be placed on the abrasion to keep it clean. You may also need a tetanus shot. HOME CARE INSTRUCTIONS Medicines  Take or apply medicines only as directed by your health care provider.  If you were prescribed an antibiotic ointment, finish all of it even if you start to feel better. Wound Care  Clean the wound with mild soap and water 2-3 times per day or as directed by your health care provider. Pat your wound dry with a clean towel. Do not rub it.  There are many different ways to close and cover a wound. Follow instructions from your health care provider about:  Wound care.  Dressing changes and removal.  Check your wound every day for signs of infection. Watch for:  Redness, swelling, or pain.  Fluid, blood, or pus. General Instructions  Keep the dressing dry as directed by your health care provider. Do not take baths, swim, use a hot tub, or do anything that would put your wound underwater until your health care provider approves.  If there is  swelling, raise (elevate) the injured area above the level of your heart while you are sitting or lying down.  Keep all follow-up visits as directed by your health care provider. This is important. SEEK MEDICAL CARE IF:  You received a tetanus shot and you have swelling, severe pain, redness, or bleeding at the injection site.  Your pain is not controlled with medicine.  You have increased redness, swelling, or pain at the site of your wound. SEEK IMMEDIATE MEDICAL CARE IF:  You have a red streak going away from your wound.  You have a fever.  You have fluid, blood, or pus coming from your wound.  You notice a bad smell coming from your wound or your dressing.   This information is not intended to replace advice given to you by your health care provider. Make sure you discuss any questions you have with your health care provider.   Document Released: 06/30/2005 Document Revised: 06/11/2015 Document Reviewed: 09/18/2014 Elsevier Interactive Patient Education 2016 [employer]  

## 2016-03-09 NOTE — Progress Notes (Signed)
Subjective:    Patient ID: Cassidy Reese, female    DOB: [birth year], 58 y.o.   MRN: [id number]  HPI  Pt presents to the clinic today to have an abrasion checked on her right foot. She reports this occurred 1 week ago. The area is sore to touch. It hurts worse when she takes a shower. She denies seeing drainage, redness or warmth in the area. She has tried OTC steroid cream without any relief. She has no history of DM.  Review of Systems  Past Medical History  Diagnosis Date  . GERD (gastroesophageal reflux disease)   . IBS (irritable bowel syndrome)   . Migraine headache     Current Outpatient Prescriptions  Medication Sig Dispense Refill  . omeprazole-sodium bicarbonate (ZEGERID) 40-1100 MG capsule TAKE 1 CAPSULE DAILY BEFOREBREAKFAST 90 capsule 3   No current facility-administered medications for this visit.    Allergies  Allergen Reactions  . Cortisone Nausea And Vomiting  . Hydrocodone-Acetaminophen     REACTION: vomiting  . Methylprednisolone Diarrhea    Also caused Body Aches    Family History  Problem Relation Age of Onset  . Heart attack Father 3869  . Diabetes Father   . Heart disease Father     Social History   Social History  . Marital Status: Married    Spouse Name: N/A  . Number of Children: 2  . Years of Education: N/A   Occupational History  . Paramedicostal worker    Social History Main Topics  . Smoking status: Never Smoker   . Smokeless tobacco: Never Used  . Alcohol Use: 0.0 oz/week    0 Standard drinks or equivalent per week     Comment: Rare  . Drug Use: No  . Sexual Activity: Not on file   Other Topics Concern  . Not on file   Social History Narrative     Constitutional: Denies fever, malaise, fatigue, headache or abrupt weight changes.  Skin: Pt reports abrasion of right ankle. Denies lesions or ulcercations.    No other specific complaints in a complete review of systems (except as listed in HPI above).     Objective:     Physical Exam   BP 120/76 mmHg  Pulse 82  Temp(Src) 98.2 F (36.8 C) (Oral)  Wt 218 lb (98.884 kg)  SpO2 99%  LMP 10/04/1985 Wt Readings from Last 3 Encounters:  03/09/16 218 lb (98.884 kg)  02/10/16 223 lb 8 oz (101.379 kg)  08/12/15 235 lb (106.595 kg)    General: Appears her stated age, in NAD. Skin: Abrasion inferior to right lateral malleolus. Not infected.  BMET    Component Value Date/Time   NA 141 08/08/2015 0740   NA 141 09/12/2013 2048   K 4.5 08/08/2015 0740   K 3.6 09/12/2013 2048   CL 105 08/08/2015 0740   CL 108* 09/12/2013 2048   CO2 31 08/08/2015 0740   CO2 30 09/12/2013 2048   GLUCOSE 160* 08/08/2015 0740   GLUCOSE 131* 09/12/2013 2048   BUN 22 08/08/2015 0740   BUN 25* 09/12/2013 2048   CREATININE 0.86 08/08/2015 0740   CREATININE 0.86 09/12/2013 2048   CALCIUM 9.3 08/08/2015 0740   CALCIUM 9.2 09/12/2013 2048   GFRNONAA >60 09/12/2013 2048   GFRNONAA >60 03/12/2011 0840   GFRAA >60 09/12/2013 2048   GFRAA >60 03/12/2011 0840    Lipid Panel     Component Value Date/Time   CHOL 194 02/05/2016 0801   TRIG 102.0  02/05/2016 0801   HDL 40.20 02/05/2016 0801   CHOLHDL 5 02/05/2016 0801   VLDL 20.4 02/05/2016 0801   LDLCALC 133* 02/05/2016 0801    CBC    Component Value Date/Time   WBC 7.6 08/08/2015 0740   WBC 7.3 09/12/2013 2048   RBC 4.64 08/08/2015 0740   RBC 4.36 09/12/2013 2048   HGB 14.5 08/08/2015 0740   HGB 13.5 09/12/2013 2048   HCT 42.6 08/08/2015 0740   HCT 39.7 09/12/2013 2048   PLT 257.0 08/08/2015 0740   PLT 213 09/12/2013 2048   MCV 91.8 08/08/2015 0740   MCV 91 09/12/2013 2048   MCH 31.0 09/12/2013 2048   MCH 31.6 03/12/2011 0840   MCHC 34.1 08/08/2015 0740   MCHC 34.1 09/12/2013 2048   RDW 13.2 08/08/2015 0740   RDW 13.1 09/12/2013 2048   LYMPHSABS 1.8 08/08/2015 0740   MONOABS 0.5 08/08/2015 0740   EOSABS 0.4 08/08/2015 0740   BASOSABS 0.0 08/08/2015 0740    Hgb A1C Lab Results  Component Value Date    HGBA1C 6.3 02/05/2016        Assessment & Plan:   Abrasion of ankle, right:  Stop using steroid cream Apply TAB or Neosporin to affected area Return precautions given  RTC as needed or if symptoms persist or worsen

## 2016-04-07 ENCOUNTER — Ambulatory Visit (INDEPENDENT_AMBULATORY_CARE_PROVIDER_SITE_OTHER): Payer: Federal, State, Local not specified - PPO | Admitting: Family Medicine

## 2016-04-07 ENCOUNTER — Encounter: Payer: Self-pay | Admitting: Family Medicine

## 2016-04-07 VITALS — BP 134/74 | HR 85 | Temp 98.8°F | Ht 64.5 in | Wt 206.5 lb

## 2016-04-07 DIAGNOSIS — E860 Dehydration: Secondary | ICD-10-CM | POA: Diagnosis not present

## 2016-04-07 DIAGNOSIS — A084 Viral intestinal infection, unspecified: Secondary | ICD-10-CM | POA: Diagnosis not present

## 2016-04-07 DIAGNOSIS — R110 Nausea: Secondary | ICD-10-CM

## 2016-04-07 MED ORDER — ONDANSETRON 4 MG PO TBDP: 4.0000 mg | ORAL_TABLET | Freq: Three times a day (TID) | ORAL | Status: DC | PRN

## 2016-04-07 NOTE — Progress Notes (Signed)
Pre visit review using our clinic review tool, if applicable. No additional management support is needed unless otherwise documented below in the visit note. 
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
## 2016-07-09 ENCOUNTER — Ambulatory Visit (INDEPENDENT_AMBULATORY_CARE_PROVIDER_SITE_OTHER): Payer: Federal, State, Local not specified - PPO | Admitting: Primary Care

## 2016-07-09 ENCOUNTER — Encounter: Payer: Self-pay | Admitting: Primary Care

## 2016-07-09 VITALS — BP 122/78 | HR 69 | Temp 98.0°F | Ht 64.5 in | Wt 193.4 lb

## 2016-07-09 DIAGNOSIS — R350 Frequency of micturition: Secondary | ICD-10-CM | POA: Diagnosis not present

## 2016-07-09 LAB — POC URINALSYSI DIPSTICK (AUTOMATED)
Bilirubin, UA: NEGATIVE
Blood, UA: NEGATIVE
Glucose, UA: NEGATIVE
KETONES UA: NEGATIVE
Leukocytes, UA: NEGATIVE
Nitrite, UA: NEGATIVE
PROTEIN UA: NEGATIVE
Urobilinogen, UA: NEGATIVE
pH, UA: 5.5

## 2016-07-09 NOTE — Patient Instructions (Signed)
Your urine does not show evidence of infection. You may try AZO for your symptoms. This may be purchased over the counter and may turn your urine orange.  Ensure you are consuming 64 ounces of water daily.  Please notify us if you develop fevers and/or your symptoms progress.  It was a pleasure meeting you!

## 2016-07-09 NOTE — Progress Notes (Signed)
Pre visit review using our clinic review tool, if applicable. No additional management support is needed unless otherwise documented below in the visit note. 
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
## 2016-07-09 NOTE — Addendum Note (Signed)
Addended by: Tawnya CrookSAMBATH, Marita Burnsed on: 07/09/2016 09:02 AM   Modules accepted: Orders

## 2016-07-11 LAB — URINE CULTURE

## 2016-07-19 ENCOUNTER — Other Ambulatory Visit: Payer: Self-pay | Admitting: Family Medicine

## 2016-07-19 DIAGNOSIS — R921 Mammographic calcification found on diagnostic imaging of breast: Secondary | ICD-10-CM

## 2016-07-28 ENCOUNTER — Ambulatory Visit
Admission: RE | Admit: 2016-07-28 | Discharge: 2016-07-28 | Disposition: A | Discharge status: Home / Self Care | Payer: Federal, State, Local not specified - PPO | Source: Ambulatory Visit | Attending: Family Medicine | Admitting: Family Medicine

## 2016-07-28 DIAGNOSIS — R921 Mammographic calcification found on diagnostic imaging of breast: Secondary | ICD-10-CM

## 2016-08-22 ENCOUNTER — Telehealth: Payer: Self-pay | Admitting: Family Medicine

## 2016-08-22 DIAGNOSIS — R7309 Other abnormal glucose: Secondary | ICD-10-CM

## 2016-08-22 DIAGNOSIS — Z0000 Encounter for general adult medical examination without abnormal findings: Secondary | ICD-10-CM

## 2016-08-22 NOTE — Telephone Encounter (Signed)
-----   Message from Baldomero LamyNatasha C Chavers sent at 08/19/2016  2:39 PM EST ----- Regarding: Cpx labs Mon 11/27, need orders. Thanks! :-) Please order  future cpx labs for pt's upcoming lab appt. Thanks Rodney Boozeasha

## 2016-08-30 ENCOUNTER — Other Ambulatory Visit (INDEPENDENT_AMBULATORY_CARE_PROVIDER_SITE_OTHER): Payer: Federal, State, Local not specified - PPO

## 2016-08-30 DIAGNOSIS — R7309 Other abnormal glucose: Secondary | ICD-10-CM | POA: Diagnosis not present

## 2016-08-30 DIAGNOSIS — Z0000 Encounter for general adult medical examination without abnormal findings: Secondary | ICD-10-CM

## 2016-08-30 LAB — LIPID PANEL
CHOL/HDL RATIO: 4
Cholesterol: 244 mg/dL — ABNORMAL HIGH (ref 0–200)
HDL: 60.6 mg/dL (ref >39.00)
LDL CALC: 167 mg/dL — AB (ref 0–99)
NonHDL: 183.79
TRIGLYCERIDES: 83 mg/dL (ref 0.0–149.0)
VLDL: 16.6 mg/dL (ref 0.0–40.0)

## 2016-08-30 LAB — CBC WITH DIFFERENTIAL/PLATELET
BASOS PCT: 0.6 % (ref 0.0–3.0)
Basophils Absolute: 0 10*3/uL (ref 0.0–0.1)
Eosinophils Absolute: 0.2 10*3/uL (ref 0.0–0.7)
Eosinophils Relative: 2.9 % (ref 0.0–5.0)
HEMATOCRIT: 42.5 % (ref 36.0–46.0)
Hemoglobin: 14.8 g/dL (ref 12.0–15.0)
LYMPHS ABS: 2 10*3/uL (ref 0.7–4.0)
LYMPHS PCT: 27.2 % (ref 12.0–46.0)
MCHC: 34.8 g/dL (ref 30.0–36.0)
MCV: 91.2 fl (ref 78.0–100.0)
MONOS PCT: 6.3 % (ref 3.0–12.0)
Monocytes Absolute: 0.5 10*3/uL (ref 0.1–1.0)
NEUTROS ABS: 4.6 10*3/uL (ref 1.4–7.7)
NEUTROS PCT: 63 % (ref 43.0–77.0)
PLATELETS: 271 10*3/uL (ref 150.0–400.0)
RBC: 4.66 Mil/uL (ref 3.87–5.11)
RDW: 13.3 % (ref 11.5–15.5)
WBC: 7.4 10*3/uL (ref 4.0–10.5)

## 2016-08-30 LAB — TSH: TSH: 2.44 u[IU]/mL (ref 0.35–4.50)

## 2016-08-30 LAB — COMPREHENSIVE METABOLIC PANEL
ALT: 14 U/L (ref 0–35)
AST: 16 U/L (ref 0–37)
Albumin: 4.4 g/dL (ref 3.5–5.2)
Alkaline Phosphatase: 81 U/L (ref 39–117)
BILIRUBIN TOTAL: 0.8 mg/dL (ref 0.2–1.2)
BUN: 20 mg/dL (ref 6–23)
CALCIUM: 9.4 mg/dL (ref 8.4–10.5)
CHLORIDE: 107 meq/L (ref 96–112)
CO2: 28 meq/L (ref 19–32)
Creatinine, Ser: 0.76 mg/dL (ref 0.40–1.20)
GFR: 83.01 mL/min (ref >60.00)
GLUCOSE: 122 mg/dL — AB (ref 70–99)
POTASSIUM: 4.4 meq/L (ref 3.5–5.1)
Sodium: 142 mEq/L (ref 135–145)
Total Protein: 7.1 g/dL (ref 6.0–8.3)

## 2016-08-30 LAB — HEMOGLOBIN A1C: Hgb A1c MFr Bld: 5.4 % (ref 4.6–6.5)

## 2016-09-01 ENCOUNTER — Encounter: Payer: Self-pay | Admitting: Family Medicine

## 2016-09-01 ENCOUNTER — Ambulatory Visit (INDEPENDENT_AMBULATORY_CARE_PROVIDER_SITE_OTHER): Payer: Federal, State, Local not specified - PPO | Admitting: Family Medicine

## 2016-09-01 VITALS — BP 126/78 | HR 76 | Temp 98.5°F | Ht 64.5 in | Wt 185.5 lb

## 2016-09-01 DIAGNOSIS — R7309 Other abnormal glucose: Secondary | ICD-10-CM

## 2016-09-01 DIAGNOSIS — Z0000 Encounter for general adult medical examination without abnormal findings: Secondary | ICD-10-CM

## 2016-09-01 DIAGNOSIS — E6609 Other obesity due to excess calories: Secondary | ICD-10-CM

## 2016-09-01 DIAGNOSIS — Z6831 Body mass index (BMI) 31.0-31.9, adult: Secondary | ICD-10-CM

## 2016-09-01 DIAGNOSIS — E782 Mixed hyperlipidemia: Secondary | ICD-10-CM

## 2016-09-01 DIAGNOSIS — Z23 Encounter for immunization: Secondary | ICD-10-CM | POA: Diagnosis not present

## 2016-09-01 MED ORDER — ATORVASTATIN CALCIUM 10 MG PO TABS: 10.0000 mg | ORAL_TABLET | Freq: Every day | ORAL | 3 refills | Status: DC
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
## 2016-09-01 NOTE — Patient Instructions (Addendum)
Start taking atorvastatin daily for cholesterol If any side effects or problems please let me know  Keep eating a healthy diet and exercising  Schedule fasting labs 6 weeks  Flu shot today

## 2016-09-01 NOTE — Progress Notes (Signed)
Pre visit review using our clinic review tool, if applicable. No additional management support is needed unless otherwise documented below in the visit note. 

## 2016-09-02 NOTE — Assessment & Plan Note (Signed)
Discussed how this problem influences overall health and the risks it imposes  Reviewed plan for weight loss with lower calorie diet (via better food choices and also portion control or program like weight watchers) and exercise building up to or more than 30 minutes 5 days per week including some aerobic activity   Commended on great job so far  

## 2016-09-02 NOTE — Assessment & Plan Note (Signed)
Lab Results  Component Value Date   HGBA1C 5.4 08/30/2016   This is improved Enc her to continue good diet/exercise and wt loss

## 2016-09-02 NOTE — Assessment & Plan Note (Signed)
Reviewed health habits including diet and exercise and skin cancer prevention Reviewed appropriate screening tests for age  Also reviewed health mt list, fam hx and immunization status , as well as social and family history   Commended re: wt loss  Enc her to keep up the healthy diet and exercise  Flu shot today  utd on screening currently -next mammogram 6 mo

## 2016-09-02 NOTE — Assessment & Plan Note (Signed)
Disc goals for lipids and reasons to control them Rev labs with pt Rev low sat fat diet in detail Good HDL but LDL is on the rise/ suspect hereditary  Will begin atorvastatin 10 mg  Disc poss side eff Re check 6 wk

## 2016-10-07 ENCOUNTER — Telehealth: Payer: Self-pay | Admitting: Family Medicine

## 2016-10-07 DIAGNOSIS — E782 Mixed hyperlipidemia: Secondary | ICD-10-CM

## 2016-10-07 NOTE — Telephone Encounter (Signed)
-----   Message from Alvina Chouerri J Walsh sent at 10/06/2016  3:46 PM EST ----- Regarding: Lab orders for Monday, 1.8.18 Lab orders for a 6 week f/u

## 2016-10-12 ENCOUNTER — Other Ambulatory Visit (INDEPENDENT_AMBULATORY_CARE_PROVIDER_SITE_OTHER): Payer: Federal, State, Local not specified - PPO

## 2016-10-12 DIAGNOSIS — E782 Mixed hyperlipidemia: Secondary | ICD-10-CM | POA: Diagnosis not present

## 2016-10-12 LAB — LIPID PANEL
CHOL/HDL RATIO: 3
Cholesterol: 194 mg/dL (ref 0–200)
HDL: 61.9 mg/dL (ref >39.00)
LDL Cholesterol: 116 mg/dL — ABNORMAL HIGH (ref 0–99)
NONHDL: 132.56
Triglycerides: 84 mg/dL (ref 0.0–149.0)
VLDL: 16.8 mg/dL (ref 0.0–40.0)

## 2016-10-12 LAB — ALT: ALT: 17 U/L (ref 0–35)

## 2016-10-12 LAB — AST: AST: 16 U/L (ref 0–37)

## 2016-10-20 ENCOUNTER — Ambulatory Visit: Payer: Federal, State, Local not specified - PPO | Admitting: Podiatry

## 2016-10-22 ENCOUNTER — Ambulatory Visit: Payer: Federal, State, Local not specified - PPO | Admitting: Podiatry

## 2016-10-27 ENCOUNTER — Encounter: Payer: Self-pay | Admitting: Podiatry

## 2016-10-27 ENCOUNTER — Ambulatory Visit (INDEPENDENT_AMBULATORY_CARE_PROVIDER_SITE_OTHER): Payer: Federal, State, Local not specified - PPO

## 2016-10-27 ENCOUNTER — Ambulatory Visit (INDEPENDENT_AMBULATORY_CARE_PROVIDER_SITE_OTHER): Payer: Federal, State, Local not specified - PPO | Admitting: Podiatry

## 2016-10-27 VITALS — BP 119/74 | HR 66 | Resp 16 | Ht 64.0 in | Wt 185.0 lb

## 2016-10-27 DIAGNOSIS — M7750 Other enthesopathy of unspecified foot: Secondary | ICD-10-CM | POA: Diagnosis not present

## 2016-10-27 DIAGNOSIS — M79672 Pain in left foot: Secondary | ICD-10-CM | POA: Diagnosis not present

## 2016-10-27 DIAGNOSIS — M779 Enthesopathy, unspecified: Secondary | ICD-10-CM | POA: Diagnosis not present

## 2016-10-27 DIAGNOSIS — M205X1 Other deformities of toe(s) (acquired), right foot: Secondary | ICD-10-CM | POA: Diagnosis not present

## 2016-10-27 DIAGNOSIS — M79671 Pain in right foot: Secondary | ICD-10-CM

## 2016-10-27 MED ORDER — TRIAMCINOLONE ACETONIDE 10 MG/ML IJ SUSP
10.0000 mg | Freq: Once | INTRAMUSCULAR | Status: AC
  Administered 2016-10-27: 10 mg

## 2016-10-27 NOTE — Progress Notes (Signed)
Subjective:     Patient ID: Cassidy Reese, female   DOB: 1955/12/17, 61 y.o.   MRN: [id number]  HPI patient presents with several problems with pain around the big toe joint right present pain in the lateral side of the left foot plantar and also just generalized discomfort in her feet that orthotics have been beneficial for   Review of Systems     Objective:   Physical Exam Neurovascular status intact muscle strength adequate range motion within normal limits with inflammation around the first MPJ right moderate limitation of motion first MPJ right history of surgery on the left big toe joint and pain in the lateral plantar aspect of the fascia around the peroneal insertion. Found have good digital perfusion well oriented 3    Assessment:     Inflammatory capsulitis first MPJ right with mild to moderate hallux limitus deformity along with tendinitis plantar lateral aspect left foot    Plan:     H&P conditions reviewed and careful injection administered of the right first MPJ 3 mg Kenalog 5 mill grams Xylocaine and also the plantar lateral tendon left 3 Milligan Kenalog 5 mg Xylocaine with application of fascially brace. Patient will utilize Motrin 800 mg at she has at home and today I scanned for custom orthotics to provide for plantar and heel lateral support  X-rays indicate that there is spurring with elongation first metatarsal segment right and changes around the base of the hallux lateral side hallux right. Patient has pins and wires in place left along with screw that are in very good position

## 2016-11-22 ENCOUNTER — Ambulatory Visit: Payer: Federal, State, Local not specified - PPO

## 2016-11-22 DIAGNOSIS — M7750 Other enthesopathy of unspecified foot: Secondary | ICD-10-CM

## 2016-11-22 NOTE — Patient Instructions (Signed)

## 2016-11-25 NOTE — Progress Notes (Signed)
Patient presents for orthotic pick up.  Verbal and written break in and wear instructions given.  Patient will follow up in 4 weeks if symptoms worsen or fail to improve. 

## 2016-12-19 ENCOUNTER — Other Ambulatory Visit: Payer: Self-pay | Admitting: Family Medicine

## 2017-03-04 ENCOUNTER — Ambulatory Visit (INDEPENDENT_AMBULATORY_CARE_PROVIDER_SITE_OTHER): Payer: Federal, State, Local not specified - PPO | Admitting: Podiatry

## 2017-03-04 ENCOUNTER — Encounter: Payer: Self-pay | Admitting: Podiatry

## 2017-03-04 DIAGNOSIS — M7750 Other enthesopathy of unspecified foot: Secondary | ICD-10-CM | POA: Diagnosis not present

## 2017-03-04 MED ORDER — TRIAMCINOLONE ACETONIDE 10 MG/ML IJ SUSP
10.0000 mg | Freq: Once | INTRAMUSCULAR | Status: AC
  Administered 2017-03-04: 10 mg

## 2017-03-04 NOTE — Progress Notes (Signed)
Subjective:    Patient ID: Cassidy CoderBeatrice Wenkel Reese, female   DOB: 61 y.o.   MRN: [id number]   HPI patient states she still having a lot of foot pain and when she tries to be active he gets very tender    ROS      Objective:  Physical Exam neurovascular status intact negative Homans sign noted with patient found to have continued lateral left foot pain that's very tender when pressed and making it hard to walk or wear shoe gear and seems to be more distal than previously     Assessment:   Tendinitis of the peroneal group left that has so far not responded to conservative treatment      Plan:    H&P condition reviewed careful sheath injection administered and applied air fracture walker to immobilize along with aggressive ice. Reappoint for us to recheck again in 3 weeks or earlier and still may end up requiring a cast

## 2017-03-15 ENCOUNTER — Encounter: Payer: Self-pay | Admitting: Podiatry

## 2017-03-25 ENCOUNTER — Encounter: Payer: Self-pay | Admitting: Podiatry

## 2017-03-25 ENCOUNTER — Ambulatory Visit (INDEPENDENT_AMBULATORY_CARE_PROVIDER_SITE_OTHER): Payer: Federal, State, Local not specified - PPO | Admitting: Podiatry

## 2017-03-25 DIAGNOSIS — M779 Enthesopathy, unspecified: Secondary | ICD-10-CM

## 2017-03-25 NOTE — Progress Notes (Signed)
Subjective:    Patient ID: Cassidy Reese, female   DOB: 61 y.o.   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 Patient be seen back for us to recheck again in 3 weeks or earlier if needed

## 2017-03-28 ENCOUNTER — Encounter: Payer: Self-pay | Admitting: Podiatry

## 2017-03-28 ENCOUNTER — Telehealth: Payer: Self-pay | Admitting: *Deleted

## 2017-03-28 MED ORDER — GABAPENTIN 300 MG PO CAPS: 300.0000 mg | ORAL_CAPSULE | Freq: Every day | ORAL | 0 refills | Status: DC

## 2017-03-28 NOTE — Telephone Encounter (Addendum)
Pt emailed a message stating she thought she was to get a prescription for bedtime. I reviewed clinical 03/25/2017 and Dr. Charlsie Merlesegal had ordered Gabapentin 300mg  hs, but had not sent to the pharmacy. Dr. Everlena Cooperegal okayed #90 Gabapentin 300mg  one po hs. Pt informed.

## 2017-03-31 ENCOUNTER — Other Ambulatory Visit: Payer: Self-pay | Admitting: Family Medicine

## 2017-03-31 DIAGNOSIS — R921 Mammographic calcification found on diagnostic imaging of breast: Secondary | ICD-10-CM

## 2017-04-08 ENCOUNTER — Encounter: Payer: Self-pay | Admitting: Podiatry

## 2017-04-11 ENCOUNTER — Telehealth: Payer: Self-pay | Admitting: *Deleted

## 2017-04-11 DIAGNOSIS — M779 Enthesopathy, unspecified: Secondary | ICD-10-CM

## 2017-04-12 NOTE — Telephone Encounter (Addendum)
Pt email request for MRI due to no improvement with PT. Dr. . Charlsie Merlesegal ordered MRI of left ankle r/o peroneal tendon tear vs tendonitis. I emailed pt that MRI had been ordered. Orders given to [facility] [facility] [employer], and [facility].04/13/2017-Dr. Regal ordered MRI of left foot instead of the left ankle to cover the left lateral foot area. I [facility] with [facility] she states put in a new order, she will make a note in their system stating the order is to change to foot and pt would like to keep the same appt. [facility].06/03/2017-Pt states her insurance is giving [employer] a hard time, because she is needing to use the knee scooter for 2 months and needs rx stating that. 06/07/2017-I spoke with pt and informed, I would write the rx with stop date of 08/04/2017. Pt asked if I could fax and mail to [employer]. I mailed and faxed rx for knee Scooter to [employer].

## 2017-04-13 ENCOUNTER — Other Ambulatory Visit: Payer: Self-pay | Admitting: Family Medicine

## 2017-04-13 ENCOUNTER — Encounter: Payer: Self-pay | Admitting: Podiatry

## 2017-04-13 ENCOUNTER — Ambulatory Visit (INDEPENDENT_AMBULATORY_CARE_PROVIDER_SITE_OTHER): Payer: Federal, State, Local not specified - PPO | Admitting: Podiatry

## 2017-04-13 ENCOUNTER — Ambulatory Visit
Admission: RE | Admit: 2017-04-13 | Discharge: 2017-04-13 | Disposition: A | Discharge status: Home / Self Care | Payer: Federal, State, Local not specified - PPO | Source: Ambulatory Visit | Attending: Family Medicine | Admitting: Family Medicine

## 2017-04-13 DIAGNOSIS — T148XXA Other injury of unspecified body region, initial encounter: Secondary | ICD-10-CM | POA: Diagnosis not present

## 2017-04-13 DIAGNOSIS — R921 Mammographic calcification found on diagnostic imaging of breast: Secondary | ICD-10-CM

## 2017-04-13 DIAGNOSIS — M7750 Other enthesopathy of unspecified foot: Secondary | ICD-10-CM | POA: Diagnosis not present

## 2017-04-13 DIAGNOSIS — Z0000 Encounter for general adult medical examination without abnormal findings: Secondary | ICD-10-CM

## 2017-04-13 NOTE — Progress Notes (Signed)
Subjective:    Patient ID: Cassidy CoderBeatrice Wenkel Reese, female   DOB: 61 y.o.   MRN: [id number]   HPI patient states that she still having quite a bit of pain in the left lateral foot and physical therapy is not been helpful immobilization has not been helpful and she cannot be active at this time    ROS      Objective:  Physical Exam neurovascular status intact with patient's left peroneal fifth metatarsal base found to be inflamed with continued discomfort at its insertion with chronic discomfort of the bone structure     Assessment:    Chronic peroneal tendinitis left with possibility for tear     Plan:   With failure to respond conservatively I am sending for MRI to see whether or not there may be a tear or fraying of the tendon with possibility for surgery depending on results and at this point we may need to consider this due to the amount of discomfort she's having for 8 months

## 2017-04-23 ENCOUNTER — Ambulatory Visit
Admission: RE | Admit: 2017-04-23 | Discharge: 2017-04-23 | Disposition: A | Discharge status: Home / Self Care | Payer: Federal, State, Local not specified - PPO | Source: Ambulatory Visit | Attending: Podiatry | Admitting: Podiatry

## 2017-04-23 ENCOUNTER — Other Ambulatory Visit: Payer: Federal, State, Local not specified - PPO

## 2017-04-27 ENCOUNTER — Encounter: Payer: Self-pay | Admitting: Podiatry

## 2017-04-29 ENCOUNTER — Ambulatory Visit (INDEPENDENT_AMBULATORY_CARE_PROVIDER_SITE_OTHER): Payer: Federal, State, Local not specified - PPO | Admitting: Podiatry

## 2017-04-29 ENCOUNTER — Encounter: Payer: Self-pay | Admitting: Podiatry

## 2017-04-29 ENCOUNTER — Telehealth: Payer: Self-pay | Admitting: Podiatry

## 2017-04-29 DIAGNOSIS — T148XXA Other injury of unspecified body region, initial encounter: Secondary | ICD-10-CM

## 2017-04-29 MED ORDER — PROMETHAZINE HCL 25 MG PO TABS: 25.0000 mg | ORAL_TABLET | Freq: Three times a day (TID) | ORAL | 0 refills | Status: DC | PRN

## 2017-04-29 MED ORDER — CEPHALEXIN 500 MG PO CAPS: 500.0000 mg | ORAL_CAPSULE | Freq: Three times a day (TID) | ORAL | 2 refills | Status: DC

## 2017-04-29 MED ORDER — MEPERIDINE HCL 50 MG PO TABS: 50.0000 mg | ORAL_TABLET | ORAL | 0 refills | Status: DC | PRN

## 2017-04-29 NOTE — Patient Instructions (Signed)

## 2017-04-29 NOTE — Telephone Encounter (Signed)
Yes I just picked up my prescriptions at the pharmacy. I cannot remember when I was supposed to start the antibiotics.I put the patient on hold and I asked both Delydia the surgical coordinator and Lahoma CrockerJessica Q, RN and they said after her surgery. I got back on the phone with the pt and told her to start them after her surgery.

## 2017-05-01 NOTE — Progress Notes (Signed)
Subjective:    Patient ID: Cassidy Reese, female   DOB: 61 y.o.   MRN: [id number]   HPI patient presents with chronic pain in the outside of the left foot and states it's now been present for approximately 8 months and has not responded to immobilization previous and sheath injection treatment anti-inflammatories physical therapy with continued pain and inability to be active    ROS      Objective:  Physical Exam neurovascular status intact negative Homans sign was noted with patient found to have exquisite discomfort in the lateral side of the left peroneal tendon as it comes underneath the cuboid and distal. She is still wearing her boot and there was mild edema noted with no fusiform swelling noted     Assessment:   Tear of the peroneal longus tendon confirmed on MRI that is most likely causing compensatory-like symptoms      Plan:    H&P condition reviewed at great length. I reviewed case with Dr. Ardelle AntonWagoner and reviewed MRI and more both in agreement that given this nine-month history surgical intervention is her best option. This case will be performed by Dr. Ardelle AntonWagoner who spent time with the patient today and reviewed case with her and me and she was able to sign consent form after extensive review understanding alternative treatments complications and the fact there is no guarantee this will solve her problem. She understands she'll be nonweightbearing for 6 weeks and today I did go ahead and dispense a long air fracture walker to completely immobilize the peroneal tendon with instructions on getting used to it prior to procedure. Patient is scheduled for surgery in the next week and is encouraged to call with questions

## 2017-05-04 ENCOUNTER — Telehealth: Payer: Self-pay | Admitting: Podiatry

## 2017-05-04 ENCOUNTER — Encounter: Payer: Self-pay | Admitting: Podiatry

## 2017-05-04 DIAGNOSIS — Z9889 Other specified postprocedural states: Secondary | ICD-10-CM

## 2017-05-04 DIAGNOSIS — M66372 Spontaneous rupture of flexor tendons, left ankle and foot: Secondary | ICD-10-CM | POA: Diagnosis not present

## 2017-05-04 NOTE — Telephone Encounter (Signed)
I spoke with pt and she states she has contacted her insurance and they will cover the knee scooter 80%, if sent to [employer]. I told her I had their fax and she gave me their [id number]. I told her I would fax the order to [facility], but if they did not get it, for her to call and ask for Clifton JamesJessica Quintana, RN and she would be able to fax the orders again.

## 2017-05-04 NOTE — Progress Notes (Signed)
Ms. Cassidy Reese presents to the office today to the [facility] for surgical intervention of a torn peroneal tendon and chronic pain to the outside of the LEFT ankle and foot. She previously had an MRI done which did reveal a peroneal longus tendon full-thickness tear below the cuboid. She has had quite a bit of pain to the outside of the foot along the peroneal tendon. She has been immobilized, had physical therapy, changed shoes/inserts, injections without any improvement. She is requesting surgical intervention at this time. Again reviewed with her the surgery and the plan. Will repair the peroneal longus if possible but likely anastomose the peroneal tendons together. She understands this and wishes to proceed. Again disused all alternatives, risks,complications. Surigcal consent was signed. She has been NPO.   No history of of any blood clots or bleeding disorders.   She already has her prescriptions at home.   She will be NWB in the CAM boot after surgery.   She has no further questions today and I answered all of her questions to the best of my ability.   Ovid CurdMatthew Wagoner, DPM

## 2017-05-04 NOTE — Telephone Encounter (Signed)
Pt called requesting a knee scooter because she has vertigo and is having difficulty with the crutches.

## 2017-05-09 ENCOUNTER — Ambulatory Visit (INDEPENDENT_AMBULATORY_CARE_PROVIDER_SITE_OTHER): Payer: Federal, State, Local not specified - PPO | Admitting: Podiatry

## 2017-05-09 ENCOUNTER — Ambulatory Visit (INDEPENDENT_AMBULATORY_CARE_PROVIDER_SITE_OTHER): Payer: Federal, State, Local not specified - PPO

## 2017-05-09 ENCOUNTER — Encounter: Payer: Self-pay | Admitting: Podiatry

## 2017-05-09 DIAGNOSIS — Z9889 Other specified postprocedural states: Secondary | ICD-10-CM

## 2017-05-09 DIAGNOSIS — T148XXA Other injury of unspecified body region, initial encounter: Secondary | ICD-10-CM | POA: Diagnosis not present

## 2017-05-09 MED ORDER — MEPERIDINE HCL 50 MG PO TABS: 50.0000 mg | ORAL_TABLET | ORAL | 0 refills | Status: DC | PRN

## 2017-05-10 NOTE — Progress Notes (Signed)
Subjective: Cassidy Reese is a 61 y.o. is seen today in office s/p left peroneal tendon repair and anastomosis preformed on 05/04/2017. She states that she is been under control her pain while taking pain medicine home. Tailors the first time she left hallux has been more active and she was 45 minutes away. Since being more active she states that she is having quite a bit of pain to her ankle today but this is the first time she's had this amount of pain. She hasn't been icing of his versus possible she's been wearing the cam boot at all times. She has been taking her antibiotics as directed.. Denies any systemic complaints such as fevers, chills, nausea, vomiting. No calf pain, chest pain, shortness of breath.   Objective: General: No acute distress, AAOx3  DP/PT pulses palpable 2/4, CRT < 3 sec to all digits.  Protective sensation intact. Motor function intact.  LEFT foot: Incision is well coapted without any evidence of dehiscence and sutures and staples are inact. There is no surrounding erythema, ascending cellulitis, fluctuance, crepitus, malodor, drainage/purulence. There is minimal edema around the surgical site. There is mild pain along the surgical site. The majority tenderness appears to be along the Achilles tendon. She states that she is always had tight Achilles tendons and since coming out of the CAM boot today she states that it is pulling cause a lot of pain. No other areas of tenderness to bilateral lower extremities.  No other open lesions or pre-ulcerative lesions.  No pain with calf compression, swelling, warmth, erythema.   Assessment and Plan:  Status post left foot sugery, doing well with no complications however with increased pain today  -Treatment options discussed including all alternatives, risks, and complications -Antibiotic ointment was applied followed by a bandage. Keep the dressing clean, dry, intact.  -Cam boot. She can try to do some gentle range of motion  exercises for the Achilles tendon while in the boot.  -Continue nonweightbearing at all times  -Ice/elevation -Pain medication as needed. refilled Demerol  -Monitor for any clinical signs or symptoms of infection and DVT/PE and directed to call the office immediately should any occur or go to the ER. -Follow-up as scheduled or sooner if any problems arise. In the meantime, encouraged to call the office with any questions, concerns, change in symptoms.   Ovid CurdMatthew Nelva Hauk, DPM

## 2017-05-16 ENCOUNTER — Encounter: Payer: Federal, State, Local not specified - PPO | Admitting: Podiatry

## 2017-05-23 ENCOUNTER — Ambulatory Visit (INDEPENDENT_AMBULATORY_CARE_PROVIDER_SITE_OTHER): Payer: Federal, State, Local not specified - PPO | Admitting: Podiatry

## 2017-05-23 ENCOUNTER — Encounter: Payer: Self-pay | Admitting: Podiatry

## 2017-05-23 DIAGNOSIS — T148XXA Other injury of unspecified body region, initial encounter: Secondary | ICD-10-CM

## 2017-05-23 DIAGNOSIS — Z9889 Other specified postprocedural states: Secondary | ICD-10-CM

## 2017-05-25 NOTE — Progress Notes (Signed)
Subjective: Cassidy Reese is a 61 y.o. is seen today in office s/p left peroneal tendon repair and anastomosis preformed on 05/04/2017. She states that she is doing "much better" than last appointment. She has not had to take any pain medication in a week. She states that she did take some today before she came in case. She states that she does try to move her ankle quite a bit to keep the motion and she is doing well doing this. She has had no recent injury.She has no new concerns today. Denies any systemic complaints such as fevers, chills, nausea, vomiting. No calf pain, chest pain, shortness of breath.   Objective: General: No acute distress, AAOx3  DP/PT pulses palpable 2/4, CRT < 3 sec to all digits.  Protective sensation intact. Motor function intact.  LEFT foot: Incision is well coapted without any evidence of dehiscence and sutures and staples are inact. There is no surrounding erythema, ascending cellulitis, fluctuance, crepitus, malodor, drainage/purulence. There is minimal edema around the surgical site. There is no pain along the surgical site. Thre is no tenderness along the Achilles tendon.No other areas of tenderness to bilateral lower extremities.  No other open lesions or pre-ulcerative lesions.  No pain with calf compression, swelling, warmth, erythema.   Assessment and Plan:  Status post left foot sugery, doing well with no complications with much improvement in pain  -Treatment options discussed including all alternatives, risks, and complications -Sutures were removed today. Staples remained intact. Antibiotic ointment was applied followed by a bandage. Keep the dressing clean, dry, intact. -she can continue the range of motion exercises for theankle. However I do want her remaining nonweightbearing in a cam boot. -Continue ice/elevation -Pain medication as needed. refilled Demerol  -Monitor for any clinical signs or symptoms of infection and DVT/PE and directed to  call the office immediately should any occur or go to the ER. -Follow-up in 1 week or sooner if any problems arise. In the meantime, encouraged to call the office with any questions, concerns, change in symptoms.   *Likely remove staples next appointment and start PT  Ovid Curd, DPM

## 2017-05-30 ENCOUNTER — Encounter: Payer: Self-pay | Admitting: Podiatry

## 2017-05-30 ENCOUNTER — Ambulatory Visit (INDEPENDENT_AMBULATORY_CARE_PROVIDER_SITE_OTHER): Payer: Self-pay | Admitting: Podiatry

## 2017-05-30 DIAGNOSIS — Z9889 Other specified postprocedural states: Secondary | ICD-10-CM

## 2017-05-30 DIAGNOSIS — T148XXA Other injury of unspecified body region, initial encounter: Secondary | ICD-10-CM

## 2017-05-30 MED ORDER — MEPERIDINE HCL 50 MG PO TABS: 50.0000 mg | ORAL_TABLET | Freq: Four times a day (QID) | ORAL | 0 refills | Status: DC | PRN

## 2017-05-31 ENCOUNTER — Encounter: Payer: Self-pay | Admitting: Podiatry

## 2017-05-31 DIAGNOSIS — T148XXA Other injury of unspecified body region, initial encounter: Secondary | ICD-10-CM | POA: Insufficient documentation

## 2017-05-31 NOTE — Progress Notes (Signed)
Subjective: Cassidy Reese is a 61 y.o. is seen today in office s/p left peroneal tendon repair and anastomosis preformed on 05/04/2017. She sates that she is doing very well. She states that she's had no pain on surgical site and she has not been taking any pain medication. She states that she has continue with range of motion exercises and ankle in doing this for any issues. She has continue the cam boot at all times and she has not been putting weight to her foot but she admits to. Denies any recent injury or fall. She has no new concerns today. She presents for stable removal. Denies any systemic complaints such as fevers, chills, nausea, vomiting. No calf pain, chest pain, shortness of breath.   Objective: General: No acute distress, AAOx3  DP/PT pulses palpable 2/4, CRT < 3 sec to all digits.  Protective sensation intact. Motor function intact.  LEFT foot: Incision is well coapted without any evidence of dehiscence and  staples are inact. There is no surrounding erythema, ascending cellulitis, fluctuance, crepitus, malodor, drainage/purulence. There is minimal edema around the surgical site. There is no pain along the surgical site. Thre is no tenderness along the Achilles tendon.No other areas of tenderness to bilateral lower extremities. Equinus is present. She cannot get her heel all the way down in the boot yet. She states again she has always had a tight tendon.  No other open lesions or pre-ulcerative lesions.  No pain with calf compression, swelling, warmth, erythema.   Assessment and Plan:  Status post left foot sugery, doing well with no complications- presents today for staple removal  -Treatment options discussed including all alternatives, risks, and complications -Staples were removed today. After removal of the incision remains well coapted. Steri-Strips are applied for reinforcement followed by antibiotic ointment and a bandage. She can start to shower tomorrow. Recommend  antibiotic ointment dressing changes daily. -Later this week she can transition to weightbearing as tolerated on went ahead and I gave her prescription for physical therapy that she can start next week..  -Continue ice/elevation -Pain medication as needed. refilled Demerol. She's not having any pain but due to starting physical therapy she may need some pain medicine for short-term.  -Monitor for any clinical signs or symptoms of infection and DVT/PE and directed to call the office immediately should any occur or go to the ER. -Follow-up in 2 weeks or sooner if any problems arise. In the meantime, encouraged to call the office with any questions, concerns, change in symptoms.   Ovid Curd, DPM

## 2017-06-03 NOTE — Progress Notes (Signed)
DOS 05-04-2017 REPAIR PERONEAL LONGUS TENDON LEFT

## 2017-06-08 ENCOUNTER — Telehealth: Payer: Self-pay | Admitting: *Deleted

## 2017-06-08 ENCOUNTER — Other Ambulatory Visit: Payer: Self-pay | Admitting: Family Medicine

## 2017-06-08 ENCOUNTER — Encounter: Payer: Self-pay | Admitting: Podiatry

## 2017-06-08 NOTE — Telephone Encounter (Signed)
I called pt and told her if felt she needed to be seen and transferred her to schedulers.

## 2017-06-09 ENCOUNTER — Encounter: Payer: Self-pay | Admitting: Podiatry

## 2017-06-09 ENCOUNTER — Other Ambulatory Visit: Payer: Self-pay | Admitting: Family Medicine

## 2017-06-09 ENCOUNTER — Ambulatory Visit (INDEPENDENT_AMBULATORY_CARE_PROVIDER_SITE_OTHER): Payer: Federal, State, Local not specified - PPO | Admitting: Podiatry

## 2017-06-09 VITALS — BP 117/80 | HR 81 | Temp 98.7°F | Resp 18

## 2017-06-09 DIAGNOSIS — Z9889 Other specified postprocedural states: Secondary | ICD-10-CM

## 2017-06-09 DIAGNOSIS — T8130XA Disruption of wound, unspecified, initial encounter: Secondary | ICD-10-CM

## 2017-06-09 DIAGNOSIS — T148XXA Other injury of unspecified body region, initial encounter: Secondary | ICD-10-CM

## 2017-06-09 MED ORDER — DOXYCYCLINE HYCLATE 100 MG PO TABS: 100.0000 mg | ORAL_TABLET | Freq: Two times a day (BID) | ORAL | 0 refills | Status: DC

## 2017-06-09 NOTE — Progress Notes (Signed)
Subjective: Cassidy Reese is a 61 y.o. is seen today in office s/p left peroneal tendon repair and anastomosis preformed on 05/04/2017. She states that she has noticed some drainage coming from the incision from where scabs come off. She describes a yellow tab red discoloration but denies any pus. She denies any swelling redness or red streaks. She has some tenderness directly along the incision but overall she is not taking any pain medication. She denies any systemic complaints such as fevers, chills, nausea, vomiting. Denies any calf pain, chest pain, shortness of breath. She is continued in a cam boot.  Objective:  General: No acute distress, AAOx3  DP/PT pulses palpable 2/4, CRT < 3 sec to all digits.  Protective sensation intact. Motor function intact.  LEFT foot: On the superior aspect of the incision is well coapted however towards the inferior portion there is a scab overlying the area which is starting to come off. There is a superficial granular wound present within the incision which is not deep. There is no probing, undermining or tunneling. There is no drainage or pus expressed there is some bloody drainage present directly on her sock. She has not been wearing a bandage overlying the area. There is no fluctuance, crepitus, malodor.  No other open lesions or pre-ulcerative lesions.  No pain with calf compression, swelling, warmth, erythema.   Assessment and Plan:  Status post left foot sugery, doing well with no complications- presents today for staple removal  -Treatment options discussed including all alternatives, risks, and complications -Area was cleaned today and incision was not going deep. However due to the opening it did prescribe antibiotics. She does not want to antevert the sclerae taken 3 times a day site prescribed doxycycline twice a day. Steri-Strip was applied followed by antibiotic ointment and bandage. She is afebrile today and vitals are stable. There was no  drainage to culture.  -Recommended a bandage daily. Hold off on physical therapy to the area is completely healed. Elevation. Limit activity. -Monitor for any clinical signs or symptoms of infection and directed to call the office immediately should any occur or go to the ER. -RTC 1 week or sooner if needed  Ovid CurdMatthew Reyah Streeter, DPM

## 2017-06-17 ENCOUNTER — Encounter: Payer: Self-pay | Admitting: Podiatry

## 2017-06-17 ENCOUNTER — Ambulatory Visit (INDEPENDENT_AMBULATORY_CARE_PROVIDER_SITE_OTHER): Payer: Self-pay | Admitting: Podiatry

## 2017-06-17 VITALS — BP 115/78 | HR 108 | Resp 18

## 2017-06-17 DIAGNOSIS — Z9889 Other specified postprocedural states: Secondary | ICD-10-CM

## 2017-06-17 DIAGNOSIS — T148XXA Other injury of unspecified body region, initial encounter: Secondary | ICD-10-CM

## 2017-06-20 ENCOUNTER — Encounter: Payer: Self-pay | Admitting: Family Medicine

## 2017-06-20 ENCOUNTER — Ambulatory Visit: Payer: Federal, State, Local not specified - PPO | Admitting: Podiatry

## 2017-06-20 MED ORDER — ATORVASTATIN CALCIUM 10 MG PO TABS: 10.0000 mg | ORAL_TABLET | Freq: Every day | ORAL | 0 refills | Status: DC

## 2017-06-20 NOTE — Progress Notes (Signed)
Subjective: Cassidy Reese is a 61 y.o. is seen today in office s/p left peroneal tendon repair and anastomosis preformed on 05/04/2017. She state that she is doing "better but there is still some swelling". She has tried putting a little bit away to her foot but wearing the cam boot. She states that the incision is no longer been draining and she denies any redness or other signs of infection to the incision. She has been trying to move her ankle and gain more range of motion she states that she cannot get her ankle up more and her Achilles tendon is feeling better. She denies any systemic complaints such as fevers, chills, nausea, vomiting. Denies any calf pain, chest pain, shortness of breath. She is continued in a cam boot.  Objective:  General: No acute distress, AAOx3  DP/PT pulses palpable 2/4, CRT < 3 sec to all digits.  Protective sensation intact. Motor function intact.  LEFT foot: On the superior aspect of the incision is well coapted initial scab overlying the incision. There is no drainage or pus expressed there is no swelling erythema, ascending synovitis. There is no questions or crepitus. There is no malodor. There is no clinical signs of infection present. It definitely some mild swelling over surgical site but is no erythema or increase in warmth. There is mild to palpation of the surgical site. No pain along the Achilles tendon or other areas of the foot.  No other open lesions or pre-ulcerative lesions.  No pain with calf compression, swelling, warmth, erythema.   Assessment and Plan:  Status post left foot sugery, doing well with no complications  -Treatment options discussed including all alternatives, risks, and complications -Incision appears to be healing and there is no drainage or any signs of infection today. All her to keep the area clean and dry. Apply a small amount of antibiotic ointment to the area daily. Continue cam boot. She can start to gradually be partial  weightbearing for short distances. Continue ice and elevation. Monitor for signs or symptoms of infection. I'll see her back in about 10 days without point likely start physical therapy on each of the incision is completely healed.  Ovid Curd, DPM

## 2017-06-20 NOTE — Telephone Encounter (Signed)
Patient scheduled her appointment for her physical on 09/19/17.

## 2017-06-20 NOTE — Addendum Note (Signed)
Addended by: Shon Millet on: 06/20/2017 10:09 AM   Modules accepted: Orders

## 2017-06-20 NOTE — Telephone Encounter (Signed)
Med refilled x 3 months. 

## 2017-06-30 ENCOUNTER — Encounter: Payer: Self-pay | Admitting: Podiatry

## 2017-06-30 ENCOUNTER — Ambulatory Visit (INDEPENDENT_AMBULATORY_CARE_PROVIDER_SITE_OTHER): Payer: Federal, State, Local not specified - PPO | Admitting: Podiatry

## 2017-06-30 DIAGNOSIS — Z9889 Other specified postprocedural states: Secondary | ICD-10-CM

## 2017-06-30 DIAGNOSIS — T148XXA Other injury of unspecified body region, initial encounter: Secondary | ICD-10-CM

## 2017-06-30 NOTE — Progress Notes (Signed)
Subjective: Cassidy Reese is a 61 y.o. is seen today in office s/p left peroneal tendon repair and anastomosis preformed on 05/04/2017. She states that she has not gone back to physical therapy because she was waiting to make sure that the incision has healed. She has been doing range of motion exercises at home and she feels that she is doing better. She's not having any pain and not taking any pain medication. She states that she can get her ankle "up" and the achilles tendon is not has as tight. She states "I am ready to start walking". She denies any systemic complaints such as fevers, chills, nausea, vomiting. Denies any calf pain, chest pain, shortness of breath. She is continued in a cam boot.  Objective:  General: No acute distress, AAOx3  DP/PT pulses palpable 2/4, CRT < 3 sec to all digits.  Protective sensation intact. Motor function intact.  LEFT foot: Incision appears to be healing well. There is a small scab on the inferior portion incision but this is well-healed. Is no drainage or pus in there is no swelling erythema, ascending cellulitis. There is no questions or crepitus. There is no malodor. No clinical signs of infection present. There is no pain on the incision site but she does report some mild numbness. There is no pain along the surgical site. There is no other areas of tenderness. Mild swelling to the ankle but no erythema or warmth.  No other open lesions or pre-ulcerative lesions.  No pain with calf compression, swelling, warmth, erythema.   Assessment and Plan:  Status post left foot sugery, doing well with no complications  -Treatment options discussed including all alternatives, risks, and complications -At this point the incision is doing well. Recommended a small metaphyseal in the buttock ointment on the incision daily. Continuing the CAM boot that she can start to gradually transition to weightbearing as tolerated in the boot. Also she can restart physical  therapy. Continue ice and elevation as well and gradually increase activity level. She is excited to be able to walk. She has no other concerns today and agrees to the plan. She will contact PT.  -RTC 2-3 weeks or sooner if needed.  Ovid Curd, DPM

## 2017-07-04 ENCOUNTER — Encounter: Payer: Self-pay | Admitting: Podiatry

## 2017-07-06 ENCOUNTER — Encounter: Payer: Self-pay | Admitting: Podiatry

## 2017-07-14 ENCOUNTER — Ambulatory Visit (INDEPENDENT_AMBULATORY_CARE_PROVIDER_SITE_OTHER): Payer: Federal, State, Local not specified - PPO | Admitting: Podiatry

## 2017-07-14 ENCOUNTER — Encounter: Payer: Self-pay | Admitting: Podiatry

## 2017-07-14 DIAGNOSIS — T148XXA Other injury of unspecified body region, initial encounter: Secondary | ICD-10-CM

## 2017-07-14 DIAGNOSIS — Z9889 Other specified postprocedural states: Secondary | ICD-10-CM

## 2017-07-14 NOTE — Progress Notes (Signed)
Subjective: Cassidy Reese is a 61 y.o. is seen today in office s/p left peroneal tendon repair and anastomosis preformed on 05/04/2017. She states that she is doing well. She isn't walking in a cam boot. She also states that she does go for short periods of time at the health's going barefoot and she states that she can do this without any pain. She has no problems going up steps that she does get some discomfort going down the steps. She states that overall she is doing well she's having no pain. She still says there is a scab part of the incision but denies any drainage or pus any redness or increase in swelling. She states that she is very happy and she is doing well. She denies any systemic complaints such as fevers, chills, nausea, vomiting. Denies any calf pain, chest pain, shortness of breath. She is continued in a cam boot.  Objective:  General: No acute distress, AAOx3  DP/PT pulses palpable 2/4, CRT < 3 sec to all digits.  Protective sensation intact. Motor function intact.  LEFT foot: Incision appears to be healing well. There is a small scab on the inferior portion incision but this is well-healed. There is no drainage or pus in there is no surroundingerythema, ascending cellulitis. There is no fluctuance or crepitus. There is no malodor. I was able to debride most the scabs in the underlying skin appears to be healed. There is minimal swelling to the surgical site. There is no pain on surgical site but she does report some mild numbness along the incision. Equinus is present. No other open lesions or pre-ulcerative lesions.  No pain with calf compression, swelling, warmth, erythema.   Assessment and Plan:  Status post left foot sugery, doing well with no complications  -Treatment options discussed including all alternatives, risks, and complications -At this time she is doing well. She can transition to regular shoe on her do this and go barefoot. Continue range of motion,  rehabilitation exercises. She feels that she do the same exercises at home but she is doing physical therapy and therefore she does not with about physical therapy. She has also been riding a bike, stationary without any problems. She will gradually increase her activity levels tolerated. I would hold off on any high-impact or jumping for now. I'll see her back in 3 weeks or sooner if any issues are to arise. Continue ice and elevation as well as compression anklet to help prevent swelling. She has no further questions or concerns.   Ovid Curd, DPM

## 2017-07-20 ENCOUNTER — Ambulatory Visit: Payer: Federal, State, Local not specified - PPO

## 2017-07-20 ENCOUNTER — Ambulatory Visit
Admission: RE | Admit: 2017-07-20 | Discharge: 2017-07-20 | Disposition: A | Discharge status: Home / Self Care | Payer: Federal, State, Local not specified - PPO | Source: Ambulatory Visit | Attending: Family Medicine | Admitting: Family Medicine

## 2017-07-20 DIAGNOSIS — Z0000 Encounter for general adult medical examination without abnormal findings: Secondary | ICD-10-CM

## 2017-08-04 ENCOUNTER — Encounter: Payer: Self-pay | Admitting: Podiatry

## 2017-08-04 ENCOUNTER — Ambulatory Visit (INDEPENDENT_AMBULATORY_CARE_PROVIDER_SITE_OTHER): Payer: Federal, State, Local not specified - PPO | Admitting: Podiatry

## 2017-08-04 DIAGNOSIS — T148XXA Other injury of unspecified body region, initial encounter: Secondary | ICD-10-CM

## 2017-08-04 DIAGNOSIS — M722 Plantar fascial fibromatosis: Secondary | ICD-10-CM | POA: Diagnosis not present

## 2017-08-07 NOTE — Progress Notes (Signed)
Subjective: Cassidy CoderBeatrice Wenkel Reese is a 61 y.o. is seen today in office s/p left peroneal tendon repair and anastomosis preformed on 05/04/2017. She states that she is improving. She still walks with somewhat of a limp given the tightness but overall she feels that she is improving. She still feels it height numbness into her Achilles as well as her ankle she's not having any significant pain. She does not want physical therapy she feels that she does not need to go. She has painted home range of motion, stretching exercises daily. She states that she has small bumps in the arch of her foot which are more painful in the surgery. She denies any recent injury or trauma she has no swelling or redness. Overall she states that she is happy soap of the outcome of the surgery and that she is improving. She has no other concerns today. She denies any systemic complaints such as fevers, chills, nausea, vomiting. Denies any calf pain, chest pain, shortness of breath. She is continued in a cam boot.  Objective:  General: No acute distress, AAOx3  DP/PT pulses palpable 2/4, CRT < 3 sec to all digits.  Protective sensation intact. Motor function intact.  LEFT foot: Incision appears to be healing well. Scar is well-healed. There is no tenderness to palpation of the surgical site. Peroneal tendons appear to be intact. There is no pain with ankle joint range of motion. Equinus is present. There is no pain along the Achilles tendon equinus is present. 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 Minimal discomfort in this area and this is the only area of tenderness to her foot. There is no overlying edema to this area. There is trace edema to the surgical site but there is no erythema or increase in warmth. No other open lesions or pre-ulcerative lesions.  No pain with calf compression, swelling, warmth, erythema.   Assessment and Plan:  Status post left foot sugery, doing well with no complications;  plantar fibroma  -Treatment options discussed including all alternatives, risks, and complications -In regards to her surgery she is doing well. I want her to continue stretching, icing exercises daily as well as wear supportive shoe. She states that the only time she has some discomfort is when she wears tennis shoes as she feels she needs a lift in her shoes. Dispensed a heel lift. This is more of the Achilles tendon however than the actual surgery. Compression anklet was dispensed today. -Regards the plantar fibromas discussed stretching for this as well. I ordered a compound cream to include verapamil. This is ordered today through [facility].  -RTC before her trip to [place] or sooner if needed.  Ovid CurdMatthew Wagoner, DPM

## 2017-08-23 ENCOUNTER — Telehealth: Payer: Self-pay | Admitting: Family Medicine

## 2017-08-23 DIAGNOSIS — R7309 Other abnormal glucose: Secondary | ICD-10-CM

## 2017-08-23 DIAGNOSIS — Z0000 Encounter for general adult medical examination without abnormal findings: Secondary | ICD-10-CM

## 2017-08-23 DIAGNOSIS — E782 Mixed hyperlipidemia: Secondary | ICD-10-CM

## 2017-08-23 NOTE — Telephone Encounter (Signed)
-----   Message from Dixie DialsAraceli Valencia, [place] sent at 08/22/2017  1:38 PM EST ----- Regarding: LABS  Patient is scheduled for labs on 09/01/17, can you please order labs. Thank you.

## 2017-08-29 ENCOUNTER — Ambulatory Visit: Payer: Federal, State, Local not specified - PPO | Admitting: Podiatry

## 2017-08-29 ENCOUNTER — Encounter: Payer: Self-pay | Admitting: Podiatry

## 2017-08-29 DIAGNOSIS — M722 Plantar fascial fibromatosis: Secondary | ICD-10-CM | POA: Diagnosis not present

## 2017-08-29 DIAGNOSIS — T148XXA Other injury of unspecified body region, initial encounter: Secondary | ICD-10-CM

## 2017-08-31 NOTE — Progress Notes (Signed)
Subjective: Debe CoderBeatrice Wenkel Szczerba is a 61 y.o. is seen today in office s/p left peroneal tendon repair and anastomosis preformed on 05/04/2017. She says that she is doing better. She still uses a heel lift in her shoes given the Achilles tightness but again she states this been ongoing for quite some time even prior to the surgery. She does still walk with somewhat of a limp but overall her foot pain is improving. Majority pain that she gets is the top of the foot as well as on the plantar fibromas the arch the foot as well. She has been to her own therapy at home and she has not been going to physical therapy. She feels that she does not need to go to physical therapy. She denies any recent injury or trauma or any increase in swelling. She states the compression anklet hurts when she wears it. She has no new concerns today. She denies any systemic complaints such as fevers, chills, nausea, vomiting. Denies any calf pain, chest pain, shortness of breath. She is continued in a cam boot.  Objective:  General: No acute distress, AAOx3  DP/PT pulses palpable 2/4, CRT < 3 sec to all digits.  Protective sensation intact. Motor function intact.  LEFT foot: Incision appears to be healing well. Scar is well-healed. There is no tenderness palpation of the surgical site. She has mild diffuse subjective tenderness along the dorsal midfoot how there is no specific area of tenderness identified of the area today. On the medial band of the plantar fascia within the arch of the foot are to firm nonmobile soft tissue masses consistent with a plantar fibroma. Mild discomfort to palpation in this area. There is no overlying skin discoloration or changes. There is no skin breakdown. There is no other areas of tenderness identified this time. Equinus is present. There is no cystic and swelling to the foot there is no erythema or increased warmth. No pain with calf compression, swelling, warmth, erythema.   Assessment and Plan:   Status post left foot sugery, doing well with no complications; plantar fibroma  -Treatment options discussed including all alternatives, risks, and complications -In regards the surgery she is doing well. She was asking about getting a new MRI to make sure that the area is healing correctly but she is having minimal pain from the surgery and its continually getting better therefore think we should hold off on MRI at this point. Ulnar continue range of motion and stretching, rehabilitation exercises at home. She states that she wants to lose weight and she has a walker. She does have a stationary bike at home and directed her to start with this before she starts increase her walking for exercise. -Discussed a steroid injection today for plantar fibroma. She is going on a trip this weekend she would like to start the steroid injections when she returns in the chart. Now continue stretching as well as the verapamil cream. -Orthotics and supportive shoes.   Vivi BarrackMatthew R Yazir Koerber DPM

## 2017-09-01 ENCOUNTER — Other Ambulatory Visit (INDEPENDENT_AMBULATORY_CARE_PROVIDER_SITE_OTHER): Payer: Federal, State, Local not specified - PPO

## 2017-09-01 DIAGNOSIS — Z0000 Encounter for general adult medical examination without abnormal findings: Secondary | ICD-10-CM | POA: Diagnosis not present

## 2017-09-01 DIAGNOSIS — E782 Mixed hyperlipidemia: Secondary | ICD-10-CM | POA: Diagnosis not present

## 2017-09-01 DIAGNOSIS — R7309 Other abnormal glucose: Secondary | ICD-10-CM

## 2017-09-01 LAB — CBC WITH DIFFERENTIAL/PLATELET
BASOS ABS: 0.1 10*3/uL (ref 0.0–0.1)
BASOS PCT: 0.9 % (ref 0.0–3.0)
EOS PCT: 3.9 % (ref 0.0–5.0)
Eosinophils Absolute: 0.3 10*3/uL (ref 0.0–0.7)
HEMATOCRIT: 43.6 % (ref 36.0–46.0)
Hemoglobin: 14.7 g/dL (ref 12.0–15.0)
LYMPHS PCT: 28.8 % (ref 12.0–46.0)
Lymphs Abs: 2.1 10*3/uL (ref 0.7–4.0)
MCHC: 33.8 g/dL (ref 30.0–36.0)
MCV: 94.1 fl (ref 78.0–100.0)
MONOS PCT: 6.2 % (ref 3.0–12.0)
Monocytes Absolute: 0.5 10*3/uL (ref 0.1–1.0)
NEUTROS ABS: 4.4 10*3/uL (ref 1.4–7.7)
Neutrophils Relative %: 60.2 % (ref 43.0–77.0)
PLATELETS: 270 10*3/uL (ref 150.0–400.0)
RBC: 4.64 Mil/uL (ref 3.87–5.11)
RDW: 13.4 % (ref 11.5–15.5)
WBC: 7.4 10*3/uL (ref 4.0–10.5)

## 2017-09-01 LAB — COMPREHENSIVE METABOLIC PANEL
ALT: 20 U/L (ref 0–35)
AST: 17 U/L (ref 0–37)
Albumin: 4.4 g/dL (ref 3.5–5.2)
Alkaline Phosphatase: 87 U/L (ref 39–117)
BUN: 26 mg/dL — ABNORMAL HIGH (ref 6–23)
CALCIUM: 9.5 mg/dL (ref 8.4–10.5)
CHLORIDE: 108 meq/L (ref 96–112)
CO2: 25 meq/L (ref 19–32)
Creatinine, Ser: 0.84 mg/dL (ref 0.40–1.20)
GFR: 73.69 mL/min (ref >60.00)
Glucose, Bld: 151 mg/dL — ABNORMAL HIGH (ref 70–99)
POTASSIUM: 4.6 meq/L (ref 3.5–5.1)
Sodium: 140 mEq/L (ref 135–145)
Total Bilirubin: 0.7 mg/dL (ref 0.2–1.2)
Total Protein: 7.3 g/dL (ref 6.0–8.3)

## 2017-09-01 LAB — LIPID PANEL
CHOL/HDL RATIO: 3
Cholesterol: 201 mg/dL — ABNORMAL HIGH (ref 0–200)
HDL: 63 mg/dL (ref >39.00)
LDL Cholesterol: 114 mg/dL — ABNORMAL HIGH (ref 0–99)
NONHDL: 138.09
Triglycerides: 120 mg/dL (ref 0.0–149.0)
VLDL: 24 mg/dL (ref 0.0–40.0)

## 2017-09-01 LAB — HEMOGLOBIN A1C: Hgb A1c MFr Bld: 5.7 % (ref 4.6–6.5)

## 2017-09-01 LAB — TSH: TSH: 3.23 u[IU]/mL (ref 0.35–4.50)

## 2017-09-19 ENCOUNTER — Encounter: Payer: Federal, State, Local not specified - PPO | Admitting: Family Medicine

## 2017-09-20 ENCOUNTER — Encounter: Payer: Self-pay | Admitting: Family Medicine

## 2017-09-20 ENCOUNTER — Ambulatory Visit (INDEPENDENT_AMBULATORY_CARE_PROVIDER_SITE_OTHER): Payer: Federal, State, Local not specified - PPO | Admitting: Family Medicine

## 2017-09-20 VITALS — BP 126/74 | HR 76 | Temp 98.3°F | Ht 64.25 in | Wt 226.5 lb

## 2017-09-20 DIAGNOSIS — Z6838 Body mass index (BMI) 38.0-38.9, adult: Secondary | ICD-10-CM

## 2017-09-20 DIAGNOSIS — T148XXA Other injury of unspecified body region, initial encounter: Secondary | ICD-10-CM

## 2017-09-20 DIAGNOSIS — K581 Irritable bowel syndrome with constipation: Secondary | ICD-10-CM | POA: Diagnosis not present

## 2017-09-20 DIAGNOSIS — E782 Mixed hyperlipidemia: Secondary | ICD-10-CM | POA: Diagnosis not present

## 2017-09-20 DIAGNOSIS — Z0000 Encounter for general adult medical examination without abnormal findings: Secondary | ICD-10-CM

## 2017-09-20 DIAGNOSIS — R7309 Other abnormal glucose: Secondary | ICD-10-CM

## 2017-09-20 MED ORDER — LINZESS 145 MCG PO CAPS: 145.0000 ug | ORAL_CAPSULE | Freq: Every day | ORAL | 3 refills | Status: AC

## 2017-09-20 MED ORDER — ATORVASTATIN CALCIUM 10 MG PO TABS: 10.0000 mg | ORAL_TABLET | Freq: Every day | ORAL | 3 refills | Status: AC

## 2017-09-20 NOTE — Patient Instructions (Addendum)
Get back on track with diet and exercise for weight loss when you can   Call your pharmacy about Shingrix and see if it is avail and affordable   Take care of yourself

## 2017-09-20 NOTE — Assessment & Plan Note (Signed)
Reviewed health habits including diet and exercise and skin cancer prevention Reviewed appropriate screening tests for age  Also reviewed health mt list, fam hx and immunization status , as well as social and family history   See HPI Labs reviewed Enc wt loss-plans to get back on track with diet and exercise (large wt gain after her tendon surgery)  Will look into coverage/avail for shingrix
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
## 2017-09-20 NOTE — Assessment & Plan Note (Signed)
Pt is no longer seeing Dr Troy SineMedhoff since he moved Refilled linzess Has failed other meds-this works well

## 2017-09-20 NOTE — Assessment & Plan Note (Signed)
Fair control with atorvastatin and diet  Disc goals for lipids and reasons to control them Rev labs with pt Rev low sat fat diet in detail

## 2017-09-20 NOTE — Assessment & Plan Note (Signed)
Doing better s/p surgery on foot

## 2017-09-20 NOTE — Assessment & Plan Note (Signed)
Lab Results  Component Value Date   HGBA1C 5.7 09/01/2017   disc imp of low glycemic diet and wt loss to prevent DM2

## 2017-10-10 ENCOUNTER — Ambulatory Visit (INDEPENDENT_AMBULATORY_CARE_PROVIDER_SITE_OTHER): Payer: Federal, State, Local not specified - PPO | Admitting: Podiatry

## 2017-10-10 ENCOUNTER — Encounter: Payer: Self-pay | Admitting: Podiatry

## 2017-10-10 DIAGNOSIS — T148XXA Other injury of unspecified body region, initial encounter: Secondary | ICD-10-CM

## 2017-10-10 DIAGNOSIS — M722 Plantar fascial fibromatosis: Secondary | ICD-10-CM | POA: Diagnosis not present

## 2017-10-11 NOTE — Progress Notes (Signed)
Subjective: Cassidy Reese is a 62 y.o. is seen today in office s/p left peroneal tendon repair and anastomosis preformed on 05/04/2017.  She states that in regards to her ankle she is doing much better.  She is about 80% improved.  She recently was on a cruise and she felt this actually helped her quite a bit and she was doing a lot of walking.  She states that going up and down steps is much better for her as well.  She has no swelling that she has noticed and she denies any redness.  She is not taking any pain medication.  She also presents today for evaluation of a plantar fibroma.  She has attempted verapamil cream as well as stretching without any significant improvement.  She will go ahead and proceed with a steroid injection.  No recent injury or trauma.  She has no other concerns today. She denies any systemic complaints such as fevers, chills, nausea, vomiting. Denies any calf pain, chest pain, shortness of breath.  Objective:  General: No acute distress, AAOx3  DP/PT pulses palpable 2/4, CRT < 3 sec to all digits.  Protective sensation intact. Motor function intact.  LEFT foot: Incision appears to be healing well. Scar is well-healed. There is no tenderness palpation of the surgical site however there is still some mild numbness to this area.  There is no pain with range of motion of the ankle or subtalar joint.  MMT is 5/5.  Overall the surgical site is healing well.  Along the medial band of plantar fascia in the arch of the foot are 2 firm non-mobile soft tissue mass is consistent with a plantar fibroma.  She states that this is what is causing the pain to her foot and not the surgical site.  No other areas of tenderness identified. No pain with calf compression, swelling, warmth, erythema.   Assessment and Plan:  Status post left foot sugery, doing well with no complications; plantar fibroma  -Treatment options discussed including all alternatives, risks, and complications -In  regards to the surgery she is doing well.  She is becoming more mobile she is wearing a regular shoe when she is not taking any at this time of discharge for the postoperative course and she agrees to this.  However discussed with any change in symptoms to call the office to be more than happy to see her.  Continue orthotics. -Regard to the plantar fibroma we discussed a steroid injection and she wished to proceed with this today.  After verbal consent was obtained the area was cleaned with alcohol and 1/4cc of Kenalog 40, and 1cc of a mixture of 0.5% Marcaine plain, 2% lidocaine plain was infiltrated directly into the plantar fibroma without any complications.  She tolerated the procedure well.  Consider ice to the area monitor for infection.  I will see her back in 4 weeks or sooner if needed.  Vivi BarrackMatthew R Wagoner DPM

## 2017-11-10 ENCOUNTER — Ambulatory Visit: Payer: Federal, State, Local not specified - PPO | Admitting: Podiatry

## 2017-11-21 ENCOUNTER — Ambulatory Visit: Payer: Federal, State, Local not specified - PPO | Admitting: Podiatry

## 2018-02-02 ENCOUNTER — Encounter: Payer: Self-pay | Admitting: Family Medicine

## 2018-02-10 NOTE — Telephone Encounter (Signed)
Pt called back about this med. She said that she is needing this med today.  She said that the fever blisters are all across her bottom lip

## 2018-02-10 NOTE — Telephone Encounter (Signed)
The pt is calling back the name of medication is valacyclovir 1 mg #4 pills. [facility]. Pt is back in town

## 2018-02-12 MED ORDER — VALACYCLOVIR HCL 1 G PO TABS: 2000.0000 mg | ORAL_TABLET | Freq: Two times a day (BID) | ORAL | 0 refills | Status: AC

## 2018-02-12 NOTE — Telephone Encounter (Signed)
valcyclovir

## 2018-03-10 ENCOUNTER — Encounter: Payer: Self-pay | Admitting: Podiatry

## 2018-03-16 ENCOUNTER — Telehealth: Payer: Self-pay | Admitting: Podiatry

## 2018-03-16 NOTE — Telephone Encounter (Signed)
I sent a message to Dr. Ardelle AntonWagoner the other day saying I'm seeing a doctor in [place] where I live now and he said I would have them yesterday. I'm at the doctor's office now and I never got my records. I told the pt we would need her to fill out and sign a medical records release form authorizing us to release her records since she is the pt. I asked the pt if they had a form there she could fill out and sign to which she replied they do. I told her to fax it to me directly at [phone] and once I get everything together I will turn around and fax it right back.
